# Patient Record
Sex: Female | Born: 1975 | Race: White | Hispanic: No | Smoking: Never smoker
Health system: Southern US, Community
[De-identification: ages and names within clinical notes are randomized; demographics above are authoritative.]

## PROBLEM LIST (undated history)

## (undated) DIAGNOSIS — Z9889 Other specified postprocedural states: Secondary | ICD-10-CM

## (undated) DIAGNOSIS — R112 Nausea with vomiting, unspecified: Secondary | ICD-10-CM

## (undated) DIAGNOSIS — E0520 Thyrotoxicosis with toxic multinodular goiter without thyrotoxic crisis or storm: Secondary | ICD-10-CM

## (undated) DIAGNOSIS — I10 Essential (primary) hypertension: Secondary | ICD-10-CM

## (undated) HISTORY — PX: ABDOMINAL HYSTERECTOMY: SHX81

## (undated) HISTORY — DX: Thyrotoxicosis with toxic multinodular goiter without thyrotoxic crisis or storm: E05.20

## (undated) HISTORY — DX: Essential (primary) hypertension: I10

---

## 2015-04-14 ENCOUNTER — Other Ambulatory Visit: Payer: Self-pay | Admitting: "Endocrinology

## 2015-04-14 DIAGNOSIS — E0590 Thyrotoxicosis, unspecified without thyrotoxic crisis or storm: Secondary | ICD-10-CM

## 2015-04-25 ENCOUNTER — Ambulatory Visit (HOSPITAL_COMMUNITY)
Admission: RE | Admit: 2015-04-25 | Discharge: 2015-04-25 | Disposition: A | Discharge status: Home / Self Care | Payer: Self-pay | Source: Ambulatory Visit | Attending: "Endocrinology | Admitting: "Endocrinology

## 2015-04-25 DIAGNOSIS — E042 Nontoxic multinodular goiter: Secondary | ICD-10-CM | POA: Insufficient documentation

## 2015-04-25 DIAGNOSIS — E0590 Thyrotoxicosis, unspecified without thyrotoxic crisis or storm: Secondary | ICD-10-CM | POA: Insufficient documentation

## 2015-05-21 ENCOUNTER — Ambulatory Visit: Payer: Self-pay | Admitting: "Endocrinology

## 2015-05-23 ENCOUNTER — Other Ambulatory Visit (INDEPENDENT_AMBULATORY_CARE_PROVIDER_SITE_OTHER): Payer: Self-pay | Admitting: Otolaryngology

## 2015-05-23 ENCOUNTER — Other Ambulatory Visit: Payer: Self-pay | Admitting: "Endocrinology

## 2015-05-23 ENCOUNTER — Telehealth: Payer: Self-pay | Admitting: "Endocrinology

## 2015-05-23 DIAGNOSIS — E042 Nontoxic multinodular goiter: Secondary | ICD-10-CM

## 2015-05-23 DIAGNOSIS — R221 Localized swelling, mass and lump, neck: Secondary | ICD-10-CM

## 2015-05-23 NOTE — Telephone Encounter (Signed)
Pt missed apt but confused about who was supposed to schedule her biopsy

## 2015-05-29 NOTE — Telephone Encounter (Signed)
Appt scheduled for 05-06-15. Attempted to call pt several times concerning insurance. No response. Letter sent to pt with appt date and time.

## 2015-06-05 ENCOUNTER — Ambulatory Visit (HOSPITAL_COMMUNITY): Admission: RE | Admit: 2015-06-05 | Payer: Self-pay | Source: Ambulatory Visit

## 2015-06-18 ENCOUNTER — Other Ambulatory Visit (HOSPITAL_COMMUNITY): Payer: Self-pay

## 2015-06-25 ENCOUNTER — Other Ambulatory Visit: Payer: Self-pay | Admitting: "Endocrinology

## 2015-06-25 ENCOUNTER — Encounter (HOSPITAL_COMMUNITY): Payer: Self-pay

## 2015-06-25 ENCOUNTER — Ambulatory Visit (HOSPITAL_COMMUNITY)
Admission: RE | Admit: 2015-06-25 | Discharge: 2015-06-25 | Disposition: A | Discharge status: Home / Self Care | Payer: Self-pay | Source: Ambulatory Visit | Attending: "Endocrinology | Admitting: "Endocrinology

## 2015-06-25 DIAGNOSIS — E042 Nontoxic multinodular goiter: Secondary | ICD-10-CM | POA: Insufficient documentation

## 2015-06-25 MED ORDER — LIDOCAINE HCL (PF) 2 % IJ SOLN
INTRAMUSCULAR | Status: AC
  Filled 2015-06-25: qty 10

## 2015-06-25 NOTE — Procedures (Signed)
PreOperative Dx: BILATERAL thyroid nodules Postoperative Dx: BILATERAL thyroid nodules Procedure:   US guided FNA of BILATERAL thyroid nodule Radiologist:  Tyron RussellBoles Anesthesia:  3 ml of 2% lidocaine Specimen:  FNA x 3 of each nodule, one on LEFT and one on RIGHT  EBL:   None Complications: None

## 2015-06-25 NOTE — Discharge Instructions (Signed)

## 2015-07-04 ENCOUNTER — Ambulatory Visit (INDEPENDENT_AMBULATORY_CARE_PROVIDER_SITE_OTHER): Payer: Self-pay | Admitting: "Endocrinology

## 2015-07-04 ENCOUNTER — Encounter: Payer: Self-pay | Admitting: "Endocrinology

## 2015-07-04 VITALS — BP 143/85 | HR 78 | Ht 60.0 in | Wt 124.6 lb

## 2015-07-04 DIAGNOSIS — E0520 Thyrotoxicosis with toxic multinodular goiter without thyrotoxic crisis or storm: Secondary | ICD-10-CM

## 2015-07-04 NOTE — Progress Notes (Signed)
Subjective:    Patient ID: Cheryl Mckenzie, female    DOB: February 22, 1976, PCP ADDIS,DANIEL, DO   Past Medical History  Diagnosis Date  . 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 Hypertension    Past Surgical History  Procedure Laterality Date  . Abdominal hysterectomy    . Cesarean section     Social History   Social History  . Marital Status: Unknown    Spouse Name: N/A  . Number of Children: N/A  . Years of Education: N/A   Social History Main Topics  . Smoking status: Never Smoker   . Smokeless tobacco: Never Used  . Alcohol Use: Yes     Comment: occ  . Drug Use: No  . Sexual Activity: Not Asked   Other Topics Concern  . None   Social History Narrative   Outpatient Encounter Prescriptions as of 07/04/2015  Medication Sig  . losartan-hydrochlorothiazide (HYZAAR) 50-12.5 MG tablet Take 1 tablet by mouth daily.  . [DISCONTINUED] methimazole (TAPAZOLE) 10 MG tablet Take 10 mg by mouth 3 (three) times daily.   No facility-administered encounter medications on file as of 07/04/2015.   ALLERGIES: No Known Allergies VACCINATION STATUS:  There is no immunization history on file for this patient.  HPI 39 yr old female with medical hx as above. She is here to f/u with repeat TFTs and fine needle aspiration of bilateral thyroid nodules  Her FNA is benign.  She stayed off of methimazole , but did not do her thyroid function test before this visit. She lost 7 pounds intentionally,  she feels better. she denies cold /heat intolerance. she denies dysphagia.   Review of Systems  Constitutional: no weight gain/loss, no fatigue, no subjective hyperthermia/hypothermia Eyes: no blurry vision, no xerophthalmia ENT: no sore throat, no nodules palpated in throat, no dysphagia/odynophagia, no hoarseness Cardiovascular: no CP/SOB/palpitations/leg swelling Respiratory: no cough/SOB Gastrointestinal: no N/V/D/C Musculoskeletal: no muscle/joint  aches Skin: no rashes Neurological: no tremors/numbness/tingling/dizziness Psychiatric: no depression/anxiety  Objective:    BP 143/85 mmHg  Pulse 78  Ht 5' (1.524 m)  Wt 124 lb 9.6 oz (56.518 kg)  BMI 24.33 kg/m2  SpO2 100%  Wt Readings from Last 3 Encounters:  07/04/15 124 lb 9.6 oz (56.518 kg)    Physical Exam  Constitutional: overweight, in NAD Eyes: PERRLA, EOMI, no exophthalmos ENT: moist mucous membranes, thyroid is palpable nodule theft on the right lobe , no cervical lymphadenopathy Cardiovascular: RRR, No MRG Respiratory: CTA B Gastrointestinal: abdomen soft, NT, ND, BS+ Musculoskeletal: no deformities, strength intact in all 4 Skin: moist, warm, no rashes Neurological: no tremor with outstretched hands, DTR normal in all 4    Assessment & Plan:   1. Toxic multinodul goiter:  Review of her prior studies showed she has had hx of toxic MNG. Her u/s showed that right lobe 5cm with 3.7 cm mixed cystic and solid nodule, left lobe 4cm with MNG largest 2.3 cm solid nodules. These nodules are now both biopsied and and  found to be nonmalignant.    Based on her  TFTs in August, she  was adequately treated for hyperthyroidism. She  stayedoff of Methimazole since then, however, she did not repeat her thyroid function test. -I will send her to lab to get fresh thyroid function test today , if hyperthyroidism  relapses , she will be considered for I131 ablative therapy.  - I advised patient to maintain close follow up with her PCP ADDIS,DANIEL, DO for  primary care needs.  Follow up plan: Return in about 2 weeks (around 07/18/2015) for overactive thyroid, labs today.  Marquis LunchGebre Petra Sargeant, MD Phone: [phone]  Fax: [phone]   07/04/2015, 11:11 AM

## 2015-07-18 ENCOUNTER — Ambulatory Visit: Payer: Self-pay | Admitting: "Endocrinology

## 2015-10-12 ENCOUNTER — Other Ambulatory Visit: Payer: Self-pay | Admitting: "Endocrinology

## 2015-10-13 NOTE — Telephone Encounter (Signed)
Do we provide this medication for Cheryl Mckenzie or does this go to PCP?

## 2015-10-13 NOTE — Telephone Encounter (Signed)
She needs to contact her primary care physician for this.

## 2017-08-25 IMAGING — US US THYROID BIOPSY
1 series · 13 of 16 positions shown · non-contrast
Comparison: Thyroid ultrasound 04/25/2015

CLINICAL DATA: BILATERAL thyroid nodules

EXAM:
ULTRASOUND GUIDED NEEDLE ASPIRATE BIOPSY OF A THYROID NODULE
ULTRASOUND GUIDED NEEDLE ASPIRATE BIOPSY OF AN ADDITIONAL THYROID
NODULE

[Series 1: us thyroid biopsy · 0.06mm/px · 16 acquisitions, 13 frames shown]
[im 1/16]
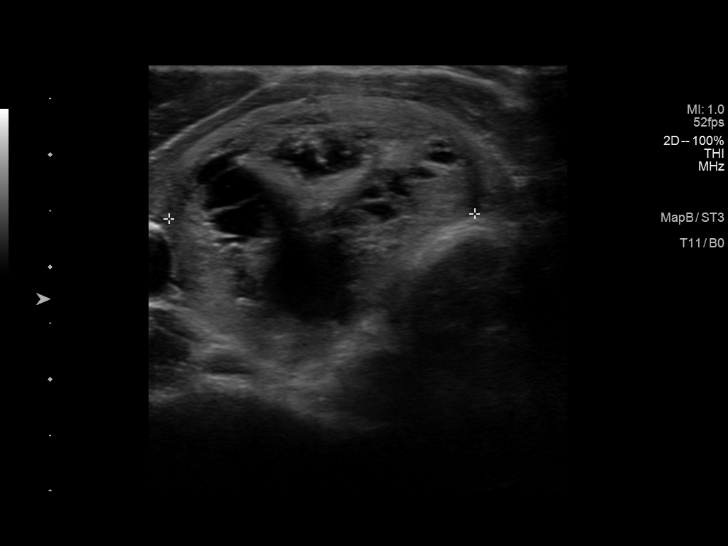
[im 2/16]
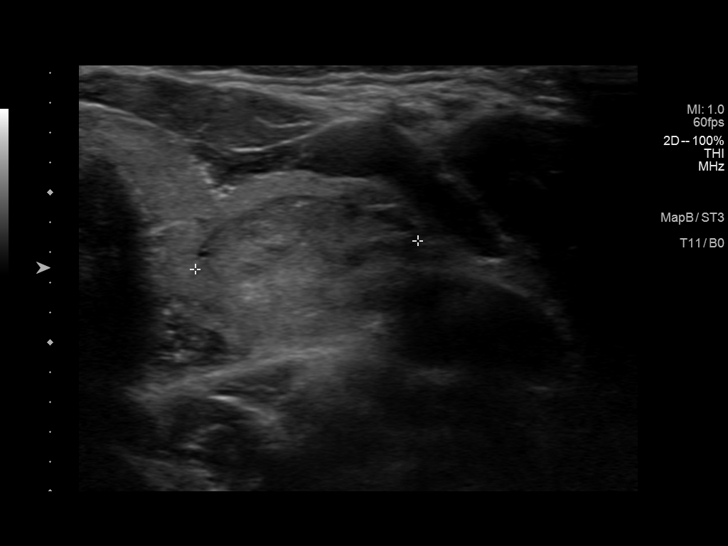
[im 4/16]
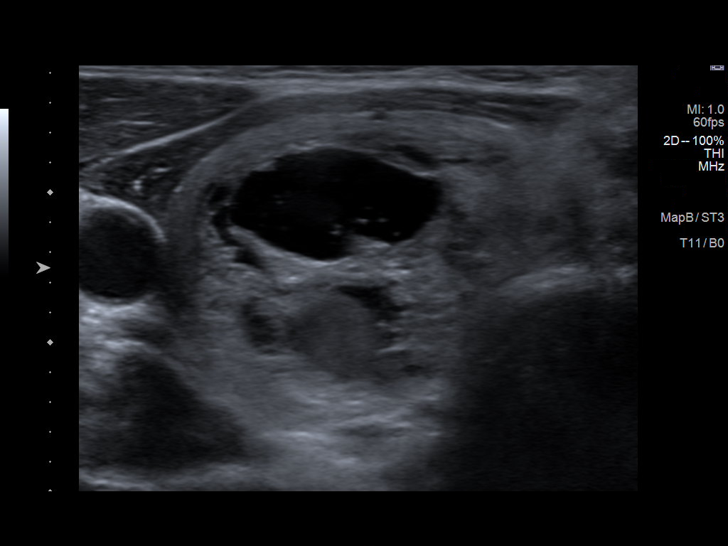
[im 5/16]
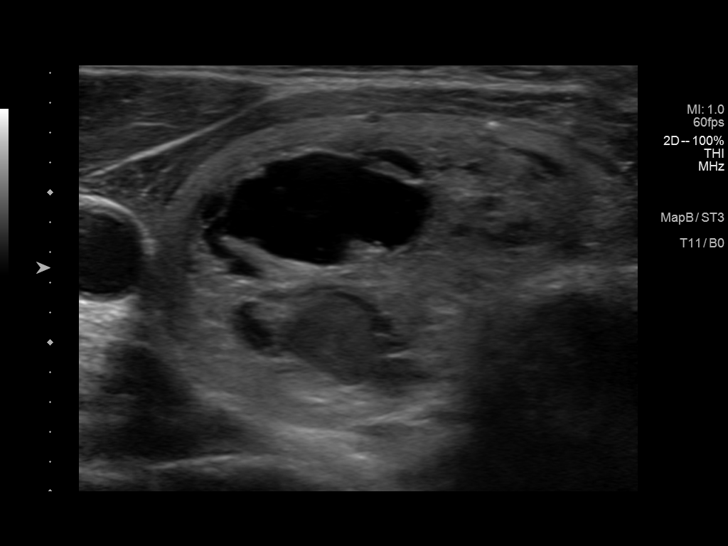
[im 6/16]
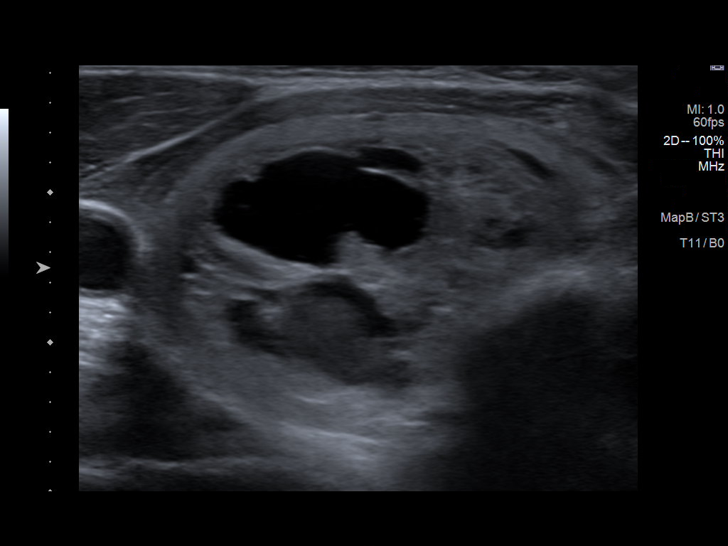
[im 7/16]
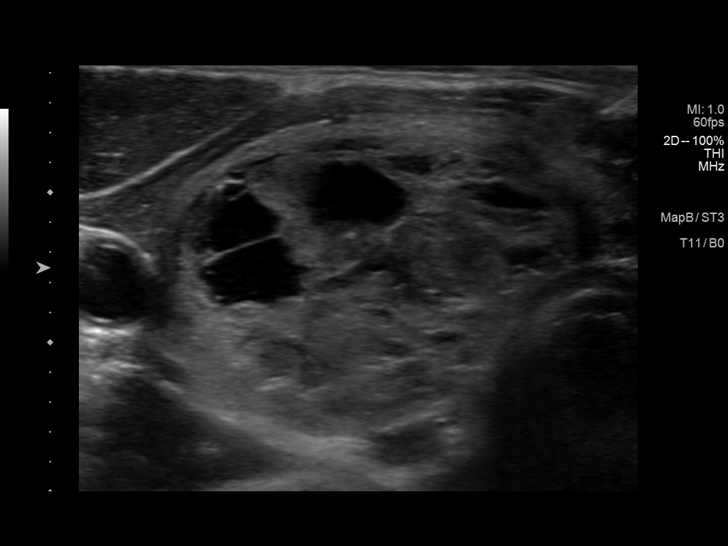
[im 9/16]
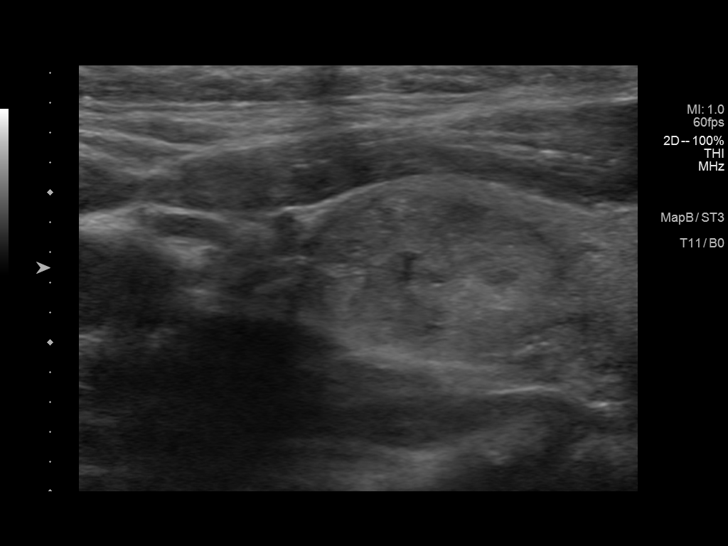
[im 10/16]
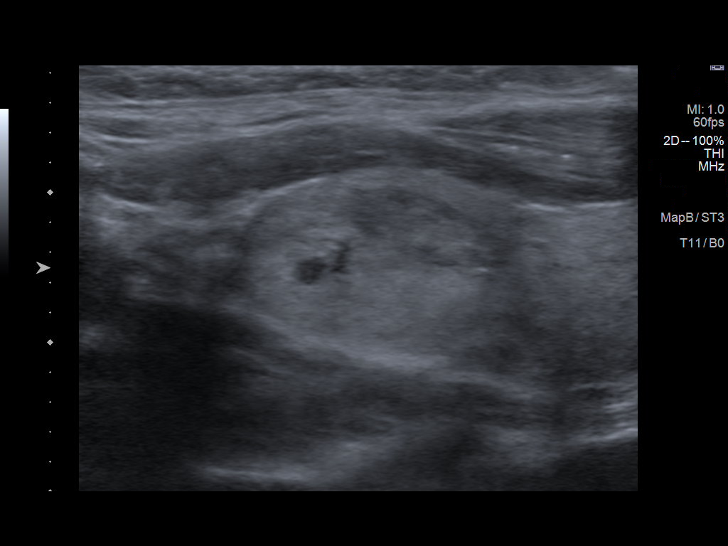
[im 11/16]
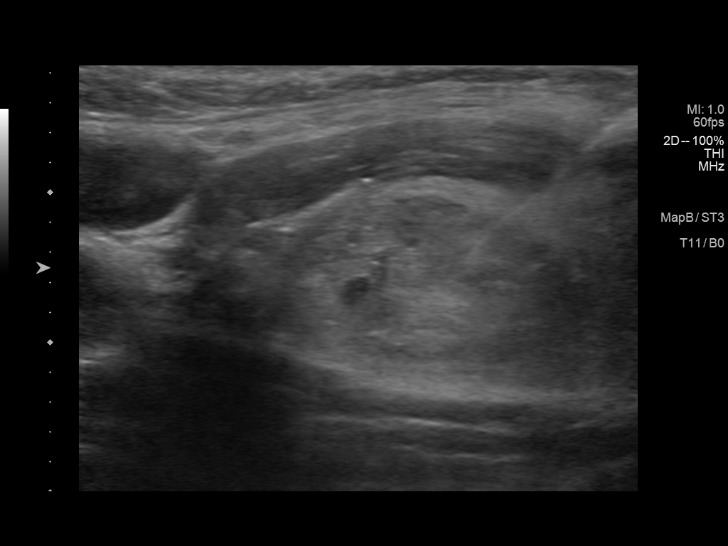
[im 12/16]
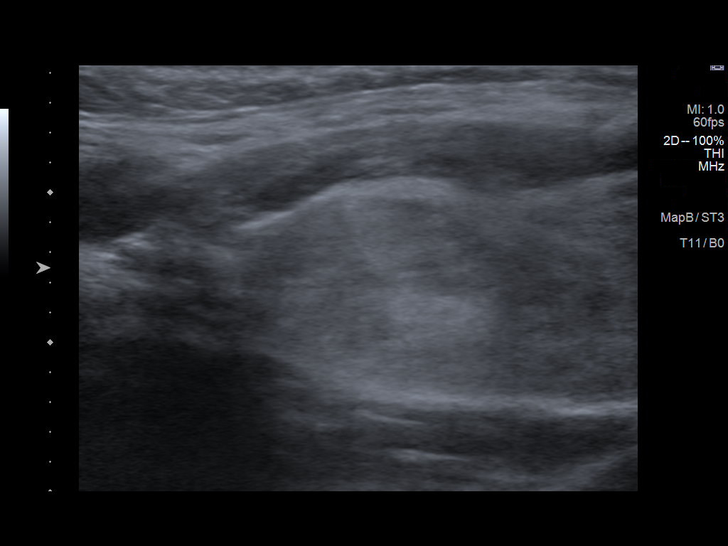
[im 13/16]
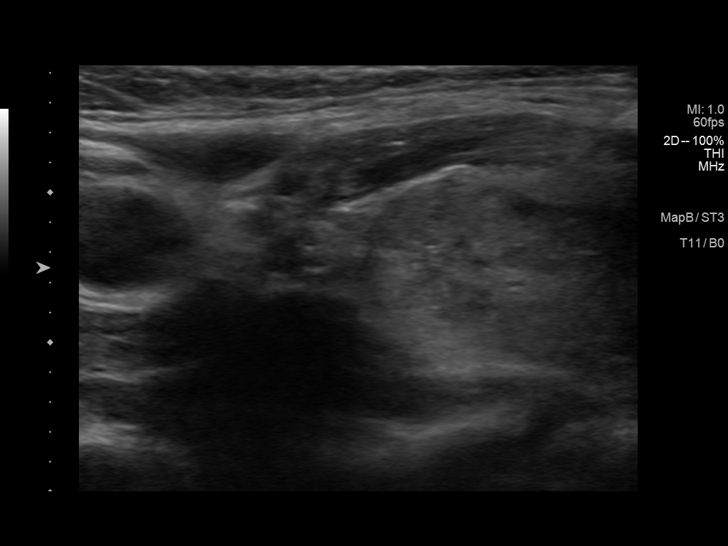
[im 15/16]
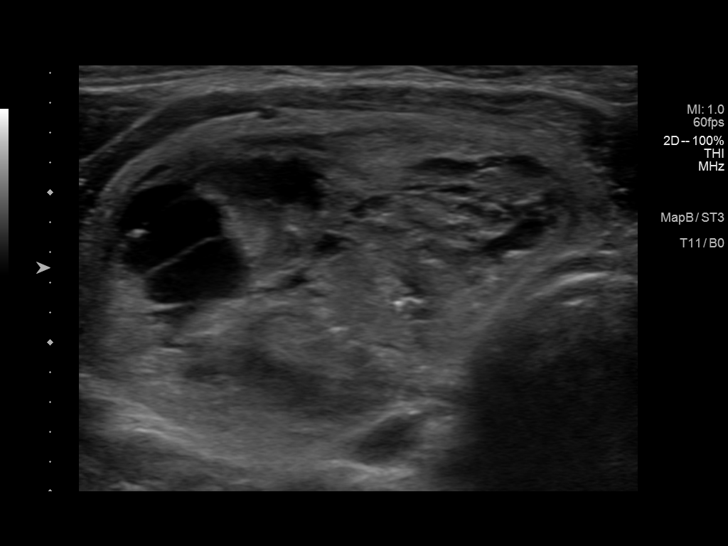
[im 16/16]
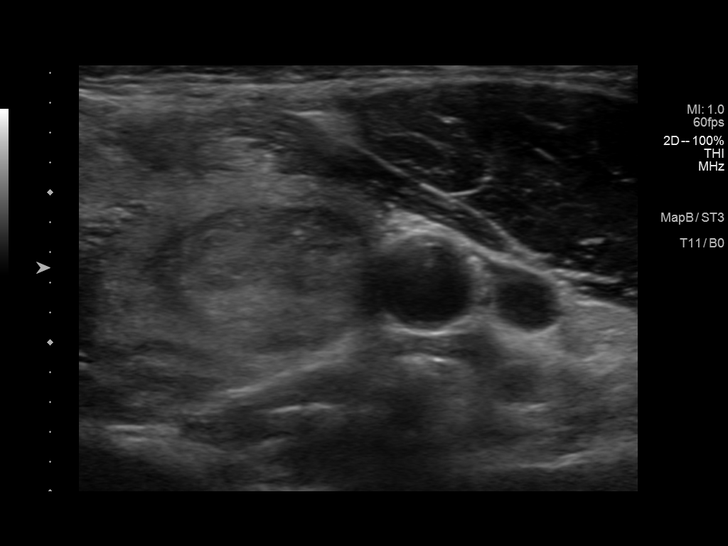

[13 of 16 positions shown; findings below may reference images not displayed]

PROCEDURE:
Procedure, risks, benefits and alternatives discussed with the
patient.

Patient's questions answered.

Written informed consent for thyroid nodule FNA obtained.

Time-out protocol followed.

Dominant nodules in both lobes localized by ultrasound

Dominant RIGHT nodule measures 3.7 x 2.1 x 3.0 cm and is a combined
solid and cystic lesion.

LEFT thyroid nodule is solid in appearance 2.3 x 1.4 x 1.6 cm and
contains tiny microcalcifications.

Skin prepped and draped in usual sterile fashion.

Skin and overlying soft tissues anesthetized with 3 mL of 2%
lidocaine.

Under direct sonographic visualization, 3 fine-needle aspirates of
the RIGHT mass were obtained.

Specimen appeared adequate.

Procedure tolerated well by patient.

Subsequently, under direct sonographic visualization, 3 fine-needle
aspirates of the LEFT thyroid nodule were obtained.

Specimen appeared adequate.

Procedure tolerated well by patient.

No immediate complications.

Specimens sent to laboratory for cytologic assessment.

COMPLICATIONS:
None
FINDINGS: As above
IMPRESSION: Ultrasound guided needle aspirate biopsy performed of BILATERAL
thyroid nodules.

## 2018-06-07 ENCOUNTER — Ambulatory Visit: Payer: Self-pay | Admitting: "Endocrinology

## 2018-07-28 ENCOUNTER — Ambulatory Visit (INDEPENDENT_AMBULATORY_CARE_PROVIDER_SITE_OTHER): Payer: Self-pay | Admitting: "Endocrinology

## 2018-07-28 ENCOUNTER — Encounter: Payer: Self-pay | Admitting: "Endocrinology

## 2018-07-28 ENCOUNTER — Other Ambulatory Visit (HOSPITAL_COMMUNITY)
Admission: RE | Admit: 2018-07-28 | Discharge: 2018-07-28 | Disposition: A | Discharge status: Home / Self Care | Payer: Self-pay | Source: Ambulatory Visit | Attending: "Endocrinology | Admitting: "Endocrinology

## 2018-07-28 VITALS — BP 137/82 | HR 102 | Ht 60.0 in | Wt 169.0 lb

## 2018-07-28 DIAGNOSIS — E0520 Thyrotoxicosis with toxic multinodular goiter without thyrotoxic crisis or storm: Secondary | ICD-10-CM | POA: Insufficient documentation

## 2018-07-28 LAB — T4, FREE: FREE T4: 1.13 ng/dL (ref 0.82–1.77)

## 2018-07-28 LAB — TSH: TSH: 0.008 u[IU]/mL — AB (ref 0.350–4.500)

## 2018-07-28 NOTE — Progress Notes (Signed)
Endocrinology Consult Note                                            07/28/2018, 1:59 PM   Subjective:    Patient ID: Cheryl Mckenzie, female    DOB: 1976-05-14, PCP Renaldo Harrison, DO   Past Medical History:  Diagnosis Date  . Hypertension   . 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 ABDOMINAL HYSTERECTOMY    . CESAREAN SECTION     Social History   Socioeconomic History  . Marital status: Unknown    Spouse name: Not on file  . Number of children: Not on file  . Years of education: Not on file  . Highest education level: Not on file  Occupational History  . Not on file  Social Needs  . Financial resource strain: Not on file  . Food insecurity:    Worry: Not on file    Inability: Not on file  . Transportation needs:    Medical: Not on file    Non-medical: Not on file  Tobacco Use  . Smoking status: Never Smoker  . Smokeless tobacco: Never Used  Substance and Sexual Activity  . Alcohol use: Yes    Comment: occ  . Drug use: No  . Sexual activity: Not on file  Lifestyle  . Physical activity:    Days per week: Not on file    Minutes per session: Not on file  . Stress: Not on file  Relationships  . Social connections:    Talks on phone: Not on file    Gets together: Not on file    Attends religious service: Not on file    Active member of club or organization: Not on file    Attends meetings of clubs or organizations: Not on file    Relationship status: Not on file  Other Topics Concern  . Not on file  Social History Narrative  . Not on file   Outpatient Encounter Medications as of 07/28/2018  Medication Sig  . esomeprazole (NEXIUM) 20 MG capsule Take 20 mg by mouth daily at 12 noon.  Marland Kitchen ESTRIOL-PROGESTERONE MICRO TD Place onto the skin once a week.  . [DISCONTINUED] losartan-hydrochlorothiazide (HYZAAR) 50-12.5 MG tablet Take 1 tablet by mouth daily.   No  facility-administered encounter medications on file as of 07/28/2018.    ALLERGIES: No Known Allergies  VACCINATION STATUS:  There is no immunization history on file for this patient.  HPI Cheryl Mckenzie is 42 y.o. female who presents today with a medical history as above. she is being seen in consultation for toxic multinodular goiter requested by Renaldo Harrison, DO.  Patient was seen prior to November 2016 for same condition.  She underwent fine-needle aspiration biopsy of bilateral thyroid nodules with benign findings.  She has been treated with methimazole for several years.  Her last exposure was 2 months ago.  -She presents with complaints including dysphagia, voice change, as well as positional difficulty breathing. -Her most recent thyroid ultrasound from July 2019 showed increase in the size of right sided thyroid nodule up to 4.3 cm.  She has moderate goiter with right lobe of the thyroid measuring 5.2 cm and left lobe of the thyroid measuring 4.3 cm. -She denies hypertensions, tremors, heat/cold intolerance.  She underwent total hysterectomy in 2015 currently on estrogen supplement. -She denies any family history of thyroid malignancy.  Review of Systems  Constitutional: + Progressive weight gain,  no fatigue, no subjective hyperthermia, no subjective hypothermia Eyes: no blurry vision, no xerophthalmia ENT: no sore throat, no nodules palpated in throat, + dysphagia, +odynophagia, + change in voice  Cardiovascular: no Chest Pain, no Shortness of Breath, no palpitations, no leg swelling Respiratory: no cough, no SOB Gastrointestinal: no Nausea/Vomiting/Diarhhea Musculoskeletal: no muscle/joint aches Skin: no rashes Neurological: no tremors, no numbness, no tingling, no dizziness Psychiatric: no depression, no anxiety  Objective:    BP 137/82   Pulse (!) 102   Ht 5' (1.524 m)   Wt 169 lb (76.7 kg)   BMI 33.01 kg/m   Wt Readings from Last 3 Encounters:  07/28/18 169 lb  (76.7 kg)  07/04/15 124 lb 9.6 oz (56.5 kg)    Physical Exam  Constitutional:  + Obese for height, not in acute distress, normal state of mind Eyes: PERRLA, EOMI, no exophthalmos ENT: moist mucous membranes, + visible, palpable  thyromegaly, no cervical lymphadenopathy, + Pemberton sign Cardiovascular: normal precordial activity, + mild tachycardia, no Murmur/Rubs/Gallops Respiratory:  adequate breathing efforts, no gross chest deformity, Clear to auscultation bilaterally Gastrointestinal: abdomen soft, Non -tender, No distension, Bowel Sounds present Musculoskeletal: no gross deformities, strength intact in all four extremities Skin: moist, warm, no rashes Neurological: no tremor with outstretched hands, Deep tendon reflexes normal in all four extremities.  She does not have recent thyroid function test to review.  Assessment & Plan:   1. Toxic multinodul goiter - Cheryl LamingJean Rankin  is being seen at a kind request of Addis, Reuel BoomDaniel, DO. - I have reviewed her available thyroid records and clinically evaluated the patient. -This patient is known to have longstanding toxic multinodular goiter, status post several years of treatment with methimazole.  2016 bilateral thyroid nodules fine-needle aspiration with benign findings.  Her most recent thyroid ultrasound showed increase in size of right thyroid nodule to 4.3 cm.  Patient presents with compressive symptoms and wishes to have definitive treatment.   -Options were discussed with her including obtaining thyroid uptake and scan and subsequent radioactive iodine thyroid ablation which may not decrease the size of the thyroid significantly for her to get relief from neck compression symptoms.  Her other option is direct  thyroidectomy which will not require thyroid uptake and scan nor fine-needle aspiration.    She agrees with total thyroidectomy, however, she would be sent for a new set of thyroid function tests.  If she is has significant  hyperthyroidism, she will be treated to target before surgery.    -She understands the subsequent need for thyroid hormone replacement after thyroidectomy.  -She will be called if her thyroid function tests are significantly abnormal and requires intervention.  Otherwise she will return to clinic after her surgery with repeat thyroid function tests. -I discussed and prepared her referral to Dr. Franky Mach[place]Mark [place]. - I did not initiate any new prescriptions today. - I advised her  to maintain close follow up with Renaldo HarrisonAddis, Daniel, DO for primary care needs.   - Time spent with the patient: 35 minutes, of which >50% was spent in obtaining information about her symptoms, reviewing her previous labs, evaluations, and treatments, counseling her about her toxic multinodular goiter, and developing a plan to confirm the diagnosis and long term treatment as necessary.  Cheryl LamingJean Vari participated in the discussions, expressed understanding, and voiced  agreement with the above plans.  All questions were answered to her satisfaction. she is encouraged to contact clinic should she have any questions or concerns prior to her return visit.  Follow up plan: Return in about 5 weeks (around 09/01/2018) for Follow up with Pre-visit Labs.   Marquis Lunch, MD [facility] [address] [place], [place] [address] Phone: [phone]  Fax: [phone]     07/28/2018, 1:59 PM  This note was partially dictated with voice recognition software. Similar sounding words can be transcribed inadequately or may not  be corrected upon review.

## 2018-07-29 LAB — THYROGLOBULIN ANTIBODY

## 2018-07-29 LAB — T3, FREE: T3, Free: 5.1 pg/mL — ABNORMAL HIGH (ref 2.0–4.4)

## 2018-07-29 LAB — THYROID PEROXIDASE ANTIBODY: THYROID PEROXIDASE ANTIBODY: 13 [IU]/mL (ref 0–34)

## 2018-08-29 ENCOUNTER — Encounter: Payer: Self-pay | Admitting: General Surgery

## 2018-08-29 ENCOUNTER — Ambulatory Visit: Payer: Self-pay | Admitting: General Surgery

## 2018-08-29 VITALS — BP 178/118 | HR 102 | Temp 98.6°F | Resp 16 | Wt 166.4 lb

## 2018-08-29 DIAGNOSIS — E0520 Thyrotoxicosis with toxic multinodular goiter without thyrotoxic crisis or storm: Secondary | ICD-10-CM

## 2018-08-29 NOTE — Patient Instructions (Signed)
Thyroidectomy A thyroidectomy is a surgery that is done to remove the thyroid gland. The thyroid is a butterfly-shaped gland that is located at the lower front of your neck. It produces thyroid hormone, which is a substance that helps to control certain body processes. You may have a:  Total thyroidectomy. All of your thyroid is removed.  Thyroid lobectomy. Part of your thyroid is removed. The amount of thyroid gland tissue that is removed during your surgery depends on the reason for the procedure. Reasons to have this procedure include treatment for:  Thyroid nodules.  Thyroid cancer.  Benign thyroid tumors.  Goiter.  Overactive thyroid gland (hyperthyroidism). There are two ways to do this procedure. Conventional, or open, thyroidectomy uses one large incision to remove the thyroid gland. This is the most common method. Endoscopic thyroidectomy, a less invasive method, uses a narrow tube with a light and camera (endoscope) to remove the gland. Tell a health care provider about:  Any allergies you have.  All medicines you are taking, including vitamins, herbs, eye drops, creams, and over-the-counter medicines.  Any problems you or family members have had with anesthetic medicines.  Any blood disorders you have.  Any surgeries you have had.  Any medical conditions you have.  Whether you are pregnant or may be pregnant. What are the risks? Generally, this is a safe procedure. However, problems may occur, including:  Damage to the parathyroid glands. These are located behind your thyroid gland. They maintain the calcium levels in the body. Damage may lead to: ? A decrease in parathyroid hormone levels (hypoparathyroidism). ? A decrease in calcium levels. This will make your nerves irritable and may cause muscle spasms.  An increase in thyroid hormone.  Damage to the nerves of your voice box (larynx). This can be temporary or long-term (rare).  Hoarseness. This usually  resolves in 24-48 hours.  Bleeding.  Infection.   Medicines Ask your health care provider about:  Changing or stopping your regular medicines. This is especially important if you are taking diabetes medicines or blood thinners.  Taking medicines such as aspirin and ibuprofen. These medicines can thin your blood. Do not take these medicines unless your health care provider tells you to take them.  Taking over-the-counter medicines, vitamins, herbs, and supplements. General instructions  You may be asked to shower with a germ-killing soap.  Plan to have someone take you home from the hospital or clinic.  Plan to have a responsible adult care for you for at least 24 hours after you leave the hospital or clinic. This is important. What happens during the procedure?  To reduce your risk of infection: ? Your health care team will wash or sanitize their hands. ? Hair may be removed from the surgical area. ? Your skin will be washed with soap.  An IV will be inserted into one of your veins.  You will be given one or more of the following: ? A medicine to help you relax (sedative). ? A medicine to make you fall asleep (general anesthetic).  Your health care provider will perform your surgery using one of two methods: ? For open thyroidectomy, an incision will be made in your lower neck. Muscles in the area will be separated to reveal your thyroid gland. ? For endoscopic thyroidectomy, several small incisions will be made in your neck, chest, or armpit. An endoscopewill be inserted into an incision.  Your health care provider may monitor laryngeal nerve function during the procedure for safety reasons.  Part or all of your thyroid gland will be removed.  A tube (drain) may be placed at the incision site to drain blood and fluids that accumulate under the skin after the procedure. The drain may have to stay in place for a day or two after the procedure.  The incision will be closed  with stitches (sutures).  A dressing will be placed over your incision. The procedure may vary among health care providers and hospitals. What happens after the procedure?  Your blood pressure, heart rate, breathing rate, and blood oxygen level will be monitored often until the medicines you were given have worn off.  You will be given pain medicine as needed.  Your provider will check your ability to talk and swallow after the procedure.  You will gradually start to drink liquids and have soft foods as tolerated.  You may have a blood test to check the level of calcium in your body.  If you had a drain put in during the procedure, it will usually be removed the next day. Summary  A thyroidectomy is a surgery that is done to remove the thyroid gland.  The procedure will be done in one of two ways: conventional, or open, thyroidectomy or endoscopic thyroidectomy.  Serious complications are rare.  Plan to have a responsible adult care for you for at least 24 hours after you leave the hospital or clinic. This is important. This information is not intended to replace advice given to you by your health care provider. Make sure you discuss any questions you have with your health care provider. Document Released: 01/26/2001 Document Revised: 06/07/2017 Document Reviewed: 06/07/2017 Elsevier Interactive Patient Education  2019 [employer].

## 2018-08-29 NOTE — Progress Notes (Signed)
Cheryl Mckenzie; [id number]; 09/21/1975   HPI Patient is a 42-year-old white female who was referred to my care by Drs. 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  She has had the goiter for many years, but seems to have increased in size and occasionally causes compressive symptoms on her throat.  She has not had ongoing dysphasia, but feels that swallowing can be occasionally uncomfortable.  She has been on medication for suppression of her thyroid gland, but given the size of the goiter, she was referred for surgical intervention.  She does not want radioactive iodine therapy.  She has never had an episode of thyroid storm.  There is no family history of thyroid cancer.  She denies any heat intolerance, unexpected weight changes, or heart palpitations.  She currently has 0 out of 10 neck pain.  She denies any voice changes Past Medical History:  Diagnosis Date  . Hypertension   . 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 ABDOMINAL HYSTERECTOMY    . CESAREAN SECTION      Family History  Problem Relation Age of Onset  . Diabetes Mother   . Hypertension Mother   . Stroke Mother     Current Outpatient Medications on File Prior to Visit  Medication Sig Dispense Refill  . esomeprazole (NEXIUM) 20 MG capsule Take 20 mg by mouth daily at 12 noon.    . ESTRIOL-PROGESTERONE MICRO TD Place onto the skin once a week.     No current facility-administered medications on file prior to visit.     No Known Allergies  Social History   Substance and Sexual Activity  Alcohol Use Yes   Comment: occ    Social History   Tobacco Use  Smoking Status Never Smoker  Smokeless Tobacco Never Used    Review of Systems  Constitutional: Positive for malaise/fatigue.  HENT: Positive for sinus pain.   Eyes: Positive for blurred vision and pain.  Respiratory: Negative.   Cardiovascular:  Negative.   Gastrointestinal: Positive for heartburn.  Genitourinary: Negative.   Musculoskeletal: Negative.   Skin: Negative.   Neurological: Negative.   Endo/Heme/Allergies: Negative.   Psychiatric/Behavioral: Negative.     Objective   Vitals:   08/29/18 1440  BP: (!) 178/118  Pulse: (!) 102  Resp: 16  Temp: 98.6 F (37 C)    Physical Exam Vitals signs reviewed.  Constitutional:      Appearance: Normal appearance. She is not ill-appearing.  HENT:     Head: Normocephalic and atraumatic.  Neck:     Musculoskeletal: Normal range of motion and neck supple. No neck rigidity or muscular tenderness.     Comments: Enlarged thyroid gland, right lobe greater than left lobe.  Trachea midline without deviation. Cardiovascular:     Rate and Rhythm: Normal rate and regular rhythm.     Heart sounds: Normal heart sounds. No murmur. No friction rub. No gallop.   Lymphadenopathy:     Cervical: No cervical adenopathy.  Skin:    General: Skin is warm and dry.  Neurological:     Mental Status: She is alert and oriented to person, place, and time.  Psychiatric:        Mood and Affect: Mood normal.        Behavior: Behavior normal.        Thought Content: Thought content normal.    Dr. Nida's notes reviewed Assessment     Toxic multinodular goiter, no history of thyroid storm  Plan   Patient is scheduled for a total thyroidectomy on 09/22/2018.  The risks and benefits of the procedure including bleeding, infection, voice changes, and the possibility of malignancy were fully explained to the patient, who gave informed consent.  She does understand she will be on lifelong thyroid supplementation after the surgery.  

## 2018-08-30 NOTE — H&P (Signed)
Cheryl Mckenzie; [id number]; 07-27-1976   HPI Patient is a 43 year old white female who was referred to my care by Drs. 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  She has had the goiter for many years, but seems to have increased in size and occasionally causes compressive symptoms on her throat.  She has not had ongoing dysphasia, but feels that swallowing can be occasionally uncomfortable.  She has been on medication for suppression of her thyroid gland, but given the size of the goiter, she was referred for surgical intervention.  She does not want radioactive iodine therapy.  She has never had an episode of thyroid storm.  There is no family history of thyroid cancer.  She denies any heat intolerance, unexpected weight changes, or heart palpitations.  She currently has 0 out of 10 neck pain.  She denies any voice changes Past Medical History:  Diagnosis Date  . Hypertension   . 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 ABDOMINAL HYSTERECTOMY    . CESAREAN SECTION      Family History  Problem Relation Age of Onset  . Diabetes Mother   . Hypertension Mother   . Stroke Mother     Current Outpatient Medications on File Prior to Visit  Medication Sig Dispense Refill  . esomeprazole (NEXIUM) 20 MG capsule Take 20 mg by mouth daily at 12 noon.    Marland Kitchen ESTRIOL-PROGESTERONE MICRO TD Place onto the skin once a week.     No current facility-administered medications on file prior to visit.     No Known Allergies  Social History   Substance and Sexual Activity  Alcohol Use Yes   Comment: occ    Social History   Tobacco Use  Smoking Status Never Smoker  Smokeless Tobacco Never Used    Review of Systems  Constitutional: Positive for malaise/fatigue.  HENT: Positive for sinus pain.   Eyes: Positive for blurred vision and pain.  Respiratory: Negative.   Cardiovascular:  Negative.   Gastrointestinal: Positive for heartburn.  Genitourinary: Negative.   Musculoskeletal: Negative.   Skin: Negative.   Neurological: Negative.   Endo/Heme/Allergies: Negative.   Psychiatric/Behavioral: Negative.     Objective   Vitals:   08/29/18 1440  BP: (!) 178/118  Pulse: (!) 102  Resp: 16  Temp: 98.6 F (37 C)    Physical Exam Vitals signs reviewed.  Constitutional:      Appearance: Normal appearance. She is not ill-appearing.  HENT:     Head: Normocephalic and atraumatic.  Neck:     Musculoskeletal: Normal range of motion and neck supple. No neck rigidity or muscular tenderness.     Comments: Enlarged thyroid gland, right lobe greater than left lobe.  Trachea midline without deviation. Cardiovascular:     Rate and Rhythm: Normal rate and regular rhythm.     Heart sounds: Normal heart sounds. No murmur. No friction rub. No gallop.   Lymphadenopathy:     Cervical: No cervical adenopathy.  Skin:    General: Skin is warm and dry.  Neurological:     Mental Status: She is alert and oriented to person, place, and time.  Psychiatric:        Mood and Affect: Mood normal.        Behavior: Behavior normal.        Thought Content: Thought content normal.    Dr. Isidoro Donning notes reviewed Assessment  Toxic multinodular goiter, no history of thyroid storm  Plan   Patient is scheduled for a total thyroidectomy on 09/22/2018.  The risks and benefits of the procedure including bleeding, infection, voice changes, and the possibility of malignancy were fully explained to the patient, who gave informed consent.  She does understand she will be on lifelong thyroid supplementation after the surgery.

## 2018-09-15 NOTE — Patient Instructions (Signed)
Your procedure is scheduled on: 09/22/2018  Report to [facility] at   6:15  AM.  Call this number if you have problems the morning of surgery: [phone]   Remember:   Do not drink or eat food:After Midnight.  :  Take these medicines the morning of surgery with A SIP OF WATER: Omerprazole   Do not wear jewelry, make-up or nail polish.  Do not wear lotions, powders, or perfumes. You may wear deodorant.  Do not shave 48 hours prior to surgery. Men may shave face and neck.  Do not bring valuables to the hospital.  Contacts, dentures or bridgework may not be worn into surgery.  Leave suitcase in the car. After surgery it may be brought to your room.  For patients admitted to the hospital, checkout time is 11:00 AM the day of discharge.   Patients discharged the day of surgery will not be allowed to drive home.    Special Instructions: Shower using CHG night before surgery and shower the day of surgery use CHG.  Use special wash - you have one bottle of CHG for all showers.  You should use approximately 1/2 of the bottle for each shower.  Thyroidectomy A thyroidectomy is a surgery that is done to remove the thyroid gland. The thyroid is a butterfly-shaped gland that is located at the lower front of your neck. It produces thyroid hormone, which is a substance that helps to control certain body processes. You may have a:  Total thyroidectomy. All of your thyroid is removed.  Thyroid lobectomy. Part of your thyroid is removed. The amount of thyroid gland tissue that is removed during your surgery depends on the reason for the procedure. Reasons to have this procedure include treatment for:  Thyroid nodules.  Thyroid cancer.  Benign thyroid tumors.  Goiter.  Overactive thyroid gland (hyperthyroidism). There are two ways to do this procedure. Conventional, or open, thyroidectomy uses one large incision to remove the thyroid gland. This is the most common method. Endoscopic  thyroidectomy, a less invasive method, uses a narrow tube with a light and camera (endoscope) to remove the gland. Tell a health care provider about:  Any allergies you have.  All medicines you are taking, including vitamins, herbs, eye drops, creams, and over-the-counter medicines.  Any problems you or family members have had with anesthetic medicines.  Any blood disorders you have.  Any surgeries you have had.  Any medical conditions you have.  Whether you are pregnant or may be pregnant. What are the risks? Generally, this is a safe procedure. However, problems may occur, including:  Damage to the parathyroid glands. These are located behind your thyroid gland. They maintain the calcium levels in the body. Damage may lead to: ? A decrease in parathyroid hormone levels (hypoparathyroidism). ? A decrease in calcium levels. This will make your nerves irritable and may cause muscle spasms.  An increase in thyroid hormone.  Damage to the nerves of your voice box (larynx). This can be temporary or long-term (rare).  Hoarseness. This usually resolves in 24-48 hours.  Bleeding.  Infection. What happens before the procedure? Staying hydrated Follow instructions from your health care provider about hydration, which may include:  Up to 2 hours before the procedure - you may continue to drink clear liquids, such as water, clear fruit juice, black coffee, and plain tea. Eating and drinking restrictions Follow instructions from your health care provider about eating and drinking, which may include:  8 hours before the  procedure - stop eating heavy meals or foods such as meat, fried foods, or fatty foods.  6 hours before the procedure - stop eating light meals or foods, such as toast or cereal.  6 hours before the procedure - stop drinking milk or drinks that contain milk.  2 hours before the procedure - stop drinking clear liquids. Medicines Ask your health care provider  about:  Changing or stopping your regular medicines. This is especially important if you are taking diabetes medicines or blood thinners.  Taking medicines such as aspirin and ibuprofen. These medicines can thin your blood. Do not take these medicines unless your health care provider tells you to take them.  Taking over-the-counter medicines, vitamins, herbs, and supplements. General instructions  You may be asked to shower with a germ-killing soap.  Plan to have someone take you home from the hospital or clinic.  Plan to have a responsible adult care for you for at least 24 hours after you leave the hospital or clinic. This is important. What happens during the procedure?  To reduce your risk of infection: ? Your health care team will wash or sanitize their hands. ? Hair may be removed from the surgical area. ? Your skin will be washed with soap.  An IV will be inserted into one of your veins.  You will be given one or more of the following: ? A medicine to help you relax (sedative). ? A medicine to make you fall asleep (general anesthetic).  Your health care provider will perform your surgery using one of two methods: ? For open thyroidectomy, an incision will be made in your lower neck. Muscles in the area will be separated to reveal your thyroid gland. ? For endoscopic thyroidectomy, several small incisions will be made in your neck, chest, or armpit. An endoscopewill be inserted into an incision.  Your health care provider may monitor laryngeal nerve function during the procedure for safety reasons.  Part or all of your thyroid gland will be removed.  A tube (drain) may be placed at the incision site to drain blood and fluids that accumulate under the skin after the procedure. The drain may have to stay in place for a day or two after the procedure.  The incision will be closed with stitches (sutures).  A dressing will be placed over your incision. The procedure may vary  among health care providers and hospitals. What happens after the procedure?  Your blood pressure, heart rate, breathing rate, and blood oxygen level will be monitored often until the medicines you were given have worn off.  You will be given pain medicine as needed.  Your provider will check your ability to talk and swallow after the procedure.  You will gradually start to drink liquids and have soft foods as tolerated.  You may have a blood test to check the level of calcium in your body.  If you had a drain put in during the procedure, it will usually be removed the next day. Summary  A thyroidectomy is a surgery that is done to remove the thyroid gland.  The procedure will be done in one of two ways: conventional, or open, thyroidectomy or endoscopic thyroidectomy.  Serious complications are rare.  Plan to have a responsible adult care for you for at least 24 hours after you leave the hospital or clinic. This is important. This information is not intended to replace advice given to you by your health care provider. Make sure you discuss any  questions you have with your health care provider. Document Released: 01/26/2001 Document Revised: 06/07/2017 Document Reviewed: 06/07/2017 Elsevier Interactive Patient Education  2019 [employer]  Thyroidectomy, Care After This sheet gives you information about how to care for yourself after your procedure. Your health care provider may also give you more specific instructions. If you have problems or questions, contact your health care provider. What can I expect after the procedure? After the procedure, it is common to have:  Mild pain in the neck or upper body, especially when swallowing.  A swollen neck.  A sore throat.  A weak or hoarse voice.  Slight tingling or numbness around your mouth, or in your fingers or toes. This may last for a day or two after surgery. This condition is caused by low levels of calcium. You may be  given calcium supplements to treat it. Follow these instructions at home:  Medicines  Take over-the-counter and prescription medicines only as told by your health care provider.  Do not drive or use heavy machinery while taking prescription pain medicine.  Do not take medicines that contain aspirin and ibuprofen until your health care provider says that you can. These medicines can increase your risk of bleeding.  Take a thyroid hormone medicine as recommended by your health care provider. You will have to take this medicine for the rest of your life if your entire thyroid was removed. Eating and drinking  Start slowly with eating. You may need to have only liquids and soft foods for a few days or as directed by your health care provider.  To prevent or treat constipation while you are taking prescription pain medicine, your health care provider may recommend that you: ? Drink enough fluid to keep your urine pale yellow. ? Take over-the-counter or prescription medicines. ? Eat foods that are high in fiber, such as fresh fruits and vegetables, whole grains, and beans. ? Limit foods that are high in fat and processed sugars, such as fried and sweet foods. Incision care  Follow instructions from your health care provider about how to take care of your incision. Make sure you: ? Wash your hands with soap and water before you change your bandage (dressing). If soap and water are not available, use hand sanitizer. ? Change your dressing as told by your health care provider. ? Leave stitches (sutures), skin glue, or adhesive strips in place. These skin closures may need to stay in place for 2 weeks or longer. If adhesive strip edges start to loosen and curl up, you may trim the loose edges. Do not remove adhesive strips completely unless your health care provider tells you to do that.  Check your incision area every day for signs of infection. Check for: ? Redness, swelling, or pain. ? Fluid  or blood. ? Warmth. ? Pus or a bad smell.  Do not take baths, swim, or use a hot tub until your health care provider approves. Activity  For the first 10 days after the procedure or as instructed by your health care provider: ? Do not lift anything that is heavier than 10 lb (4.5 kg). ? Do not jog, swim, or do other strenuous exercises. ? Do not play contact sports.  Avoid sitting for a long time without moving. Get up to take short walks every 1-2 hours. This is needed to improve blood flow and breathing. Ask for help if you feel weak or unsteady.  Return to your normal activities as told by your health care  provider. Ask your health care provider what activities are safe for you. General instructions  Do not use any products that contain nicotine or tobacco, such as cigarettes and e-cigarettes. These can delay healing after surgery. If you need help quitting, ask your health care provider.  Keep all follow-up visits as told by your health care provider. This is important. Your health care provider needs to monitor the calcium level in your blood to make sure that it does not become low. Contact a health care provider if you:  Have a fever.  Have more redness, swelling, or pain around your incision area.  Have fluid or blood coming from your incision area.  Notice that your incision area feels warm to the touch.  Have pus or a bad smell coming from your incision area.  Have trouble talking.  Have nausea or vomiting for more than 2 days. Get help right away if you:  Have trouble breathing.  Have trouble swallowing.  Develop a rash.  Develop a cough that gets worse.  Notice that your speech changes, or you have hoarseness that gets worse.  Develop numbness, tingling, or muscle spasms in the arms, hands, feet, or face. Summary  After the procedure, it is common to feel mild pain in the neck or upper body, especially when swallowing.  Take medicines as told by your  health care provider. These include pain medicines and thyroid hormones, if required.  Follow instructions from your health care provider about how to take care of your incision. Watch for signs of infection.  Keep all follow-up visits as told by your health care provider. This is important. Your health care provider needs to monitor the calcium level in your blood to make sure that it does not become low.  Get help right away if you develop difficulty breathing, or numbness, tingling, or muscle spasms in the arms, hands, feet, or face. This information is not intended to replace advice given to you by your health care provider. Make sure you discuss any questions you have with your health care provider. Document Released: 02/19/2005 Document Revised: 06/07/2017 Document Reviewed: 06/07/2017 Elsevier Interactive Patient Education  2019 [employer]  General Anesthesia, Adult, Care After This sheet gives you information about how to care for yourself after your procedure. Your health care provider may also give you more specific instructions. If you have problems or questions, contact your health care provider. What can I expect after the procedure? After the procedure, the following side effects are common:  Pain or discomfort at the IV site.  Nausea.  Vomiting.  Sore throat.  Trouble concentrating.  Feeling cold or chills.  Weak or tired.  Sleepiness and fatigue.  Soreness and body aches. These side effects can affect parts of the body that were not involved in surgery. Follow these instructions at home:  For at least 24 hours after the procedure:  Have a responsible adult stay with you. It is important to have someone help care for you until you are awake and alert.  Rest as needed.  Do not: ? Participate in activities in which you could fall or become injured. ? Drive. ? Use heavy machinery. ? Drink alcohol. ? Take sleeping pills or medicines that cause  drowsiness. ? Make important decisions or sign legal documents. ? Take care of children on your own. Eating and drinking  Follow any instructions from your health care provider about eating or drinking restrictions.  When you feel hungry, start by eating small amounts  of foods that are soft and easy to digest (bland), such as toast. Gradually return to your regular diet.  Drink enough fluid to keep your urine pale yellow.  If you vomit, rehydrate by drinking water, juice, or clear broth. General instructions  If you have sleep apnea, surgery and certain medicines can increase your risk for breathing problems. Follow instructions from your health care provider about wearing your sleep device: ? Anytime you are sleeping, including during daytime naps. ? While taking prescription pain medicines, sleeping medicines, or medicines that make you drowsy.  Return to your normal activities as told by your health care provider. Ask your health care provider what activities are safe for you.  Take over-the-counter and prescription medicines only as told by your health care provider.  If you smoke, do not smoke without supervision.  Keep all follow-up visits as told by your health care provider. This is important. Contact a health care provider if:  You have nausea or vomiting that does not get better with medicine.  You cannot eat or drink without vomiting.  You have pain that does not get better with medicine.  You are unable to pass urine.  You develop a skin rash.  You have a fever.  You have redness around your IV site that gets worse. Get help right away if:  You have difficulty breathing.  You have chest pain.  You have blood in your urine or stool, or you vomit blood. Summary  After the procedure, it is common to have a sore throat or nausea. It is also common to feel tired.  Have a responsible adult stay with you for the first 24 hours after general anesthesia. It is  important to have someone help care for you until you are awake and alert.  When you feel hungry, start by eating small amounts of foods that are soft and easy to digest (bland), such as toast. Gradually return to your regular diet.  Drink enough fluid to keep your urine pale yellow.  Return to your normal activities as told by your health care provider. Ask your health care provider what activities are safe for you. This information is not intended to replace advice given to you by your health care provider. Make sure you discuss any questions you have with your health care provider. Document Released: 11/08/2000 Document Revised: 03/18/2017 Document Reviewed: 03/18/2017 Elsevier Interactive Patient Education  2019 [employer]

## 2018-09-19 ENCOUNTER — Encounter (HOSPITAL_COMMUNITY): Payer: Self-pay

## 2018-09-20 ENCOUNTER — Ambulatory Visit (HOSPITAL_COMMUNITY)
Admission: RE | Admit: 2018-09-20 | Discharge: 2018-09-20 | Disposition: A | Discharge status: Home / Self Care | Payer: Self-pay | Source: Ambulatory Visit | Attending: General Surgery | Admitting: General Surgery

## 2018-09-20 ENCOUNTER — Other Ambulatory Visit: Payer: Self-pay

## 2018-09-20 ENCOUNTER — Encounter (HOSPITAL_COMMUNITY)
Admission: RE | Admit: 2018-09-20 | Discharge: 2018-09-20 | Disposition: A | Discharge status: Home / Self Care | Payer: Self-pay | Source: Ambulatory Visit | Attending: General Surgery | Admitting: General Surgery

## 2018-09-20 ENCOUNTER — Encounter (HOSPITAL_COMMUNITY): Payer: Self-pay

## 2018-09-20 DIAGNOSIS — E049 Nontoxic goiter, unspecified: Secondary | ICD-10-CM | POA: Insufficient documentation

## 2018-09-20 HISTORY — DX: Nausea with vomiting, unspecified: R11.2

## 2018-09-20 HISTORY — DX: Other specified postprocedural states: Z98.890

## 2018-09-20 LAB — CBC WITH DIFFERENTIAL/PLATELET
Abs Immature Granulocytes: 0.02 10*3/uL (ref 0.00–0.07)
Basophils Absolute: 0.1 10*3/uL (ref 0.0–0.1)
Basophils Relative: 1 %
EOS ABS: 0.1 10*3/uL (ref 0.0–0.5)
Eosinophils Relative: 2 %
HCT: 41.3 % (ref 36.0–46.0)
Hemoglobin: 13.6 g/dL (ref 12.0–15.0)
Immature Granulocytes: 0 %
Lymphocytes Relative: 43 %
Lymphs Abs: 2.4 10*3/uL (ref 0.7–4.0)
MCH: 29.3 pg (ref 26.0–34.0)
MCHC: 32.9 g/dL (ref 30.0–36.0)
MCV: 89 fL (ref 80.0–100.0)
Monocytes Absolute: 0.4 10*3/uL (ref 0.1–1.0)
Monocytes Relative: 7 %
Neutro Abs: 2.6 10*3/uL (ref 1.7–7.7)
Neutrophils Relative %: 47 %
Platelets: 318 10*3/uL (ref 150–400)
RBC: 4.64 MIL/uL (ref 3.87–5.11)
RDW: 12.7 % (ref 11.5–15.5)
WBC: 5.6 10*3/uL (ref 4.0–10.5)
nRBC: 0 % (ref 0.0–0.2)

## 2018-09-20 LAB — COMPREHENSIVE METABOLIC PANEL
ALT: 39 U/L (ref 0–44)
ANION GAP: 8 (ref 5–15)
AST: 28 U/L (ref 15–41)
Albumin: 4 g/dL (ref 3.5–5.0)
Alkaline Phosphatase: 80 U/L (ref 38–126)
BUN: 16 mg/dL (ref 6–20)
CO2: 25 mmol/L (ref 22–32)
Calcium: 9.6 mg/dL (ref 8.9–10.3)
Chloride: 105 mmol/L (ref 98–111)
Creatinine, Ser: 0.76 mg/dL (ref 0.44–1.00)
GFR calc Af Amer: >60 mL/min (ref >60)
GFR calc non Af Amer: >60 mL/min (ref >60)
Glucose, Bld: 100 mg/dL — ABNORMAL HIGH (ref 70–99)
POTASSIUM: 3.8 mmol/L (ref 3.5–5.1)
Sodium: 138 mmol/L (ref 135–145)
Total Bilirubin: 0.4 mg/dL (ref 0.3–1.2)
Total Protein: 7.9 g/dL (ref 6.5–8.1)

## 2018-09-20 LAB — HCG, SERUM, QUALITATIVE: Preg, Serum: NEGATIVE

## 2018-09-20 LAB — TSH: TSH: <0.01 u[IU]/mL — ABNORMAL LOW (ref 0.350–4.500)

## 2018-09-22 ENCOUNTER — Other Ambulatory Visit: Payer: Self-pay

## 2018-09-22 ENCOUNTER — Encounter (HOSPITAL_COMMUNITY)
Admission: RE | Disposition: A | Discharge status: Home / Self Care | Payer: Self-pay | Source: Home / Self Care | Attending: General Surgery

## 2018-09-22 ENCOUNTER — Ambulatory Visit (HOSPITAL_COMMUNITY): Payer: Self-pay | Admitting: Anesthesiology

## 2018-09-22 ENCOUNTER — Observation Stay (HOSPITAL_COMMUNITY)
Admission: RE | Admit: 2018-09-22 | Discharge: 2018-09-23 | Disposition: A | Discharge status: Home / Self Care | Payer: Self-pay | Attending: General Surgery | Admitting: General Surgery

## 2018-09-22 ENCOUNTER — Encounter (HOSPITAL_COMMUNITY): Payer: Self-pay

## 2018-09-22 DIAGNOSIS — Z79890 Hormone replacement therapy (postmenopausal): Secondary | ICD-10-CM | POA: Insufficient documentation

## 2018-09-22 DIAGNOSIS — E0520 Thyrotoxicosis with toxic multinodular goiter without thyrotoxic crisis or storm: Principal | ICD-10-CM | POA: Insufficient documentation

## 2018-09-22 DIAGNOSIS — Z79899 Other long term (current) drug therapy: Secondary | ICD-10-CM | POA: Insufficient documentation

## 2018-09-22 DIAGNOSIS — Z9889 Other specified postprocedural states: Secondary | ICD-10-CM

## 2018-09-22 DIAGNOSIS — I10 Essential (primary) hypertension: Secondary | ICD-10-CM | POA: Insufficient documentation

## 2018-09-22 DIAGNOSIS — E890 Postprocedural hypothyroidism: Secondary | ICD-10-CM

## 2018-09-22 HISTORY — PX: THYROIDECTOMY: SHX17

## 2018-09-22 LAB — COMPREHENSIVE METABOLIC PANEL
ALK PHOS: 77 U/L (ref 38–126)
ALT: 30 U/L (ref 0–44)
AST: 27 U/L (ref 15–41)
Albumin: 3.5 g/dL (ref 3.5–5.0)
Anion gap: 7 (ref 5–15)
BUN: 18 mg/dL (ref 6–20)
CO2: 25 mmol/L (ref 22–32)
Calcium: 8.6 mg/dL — ABNORMAL LOW (ref 8.9–10.3)
Chloride: 104 mmol/L (ref 98–111)
Creatinine, Ser: 0.83 mg/dL (ref 0.44–1.00)
GFR calc Af Amer: >60 mL/min (ref >60)
GFR calc non Af Amer: >60 mL/min (ref >60)
Glucose, Bld: 166 mg/dL — ABNORMAL HIGH (ref 70–99)
Potassium: 4 mmol/L (ref 3.5–5.1)
Sodium: 136 mmol/L (ref 135–145)
Total Bilirubin: 0.4 mg/dL (ref 0.3–1.2)
Total Protein: 6.9 g/dL (ref 6.5–8.1)

## 2018-09-22 SURGERY — THYROIDECTOMY
Anesthesia: General

## 2018-09-22 MED ORDER — HYDROCODONE-ACETAMINOPHEN 5-325 MG PO TABS
1.0000 | ORAL_TABLET | ORAL | Status: DC | PRN
  Administered 2018-09-23: 1 via ORAL
  Filled 2018-09-22: qty 1

## 2018-09-22 MED ORDER — ONDANSETRON HCL 4 MG/2ML IJ SOLN: 4.0000 mg | Freq: Four times a day (QID) | INTRAMUSCULAR | Status: DC | PRN

## 2018-09-22 MED ORDER — ROCURONIUM BROMIDE 100 MG/10ML IV SOLN
INTRAVENOUS | Status: DC | PRN
  Administered 2018-09-22: 40 mg via INTRAVENOUS

## 2018-09-22 MED ORDER — CHLORHEXIDINE GLUCONATE CLOTH 2 % EX PADS: 6.0000 | MEDICATED_PAD | Freq: Once | CUTANEOUS | Status: DC

## 2018-09-22 MED ORDER — HYDROMORPHONE HCL 1 MG/ML IJ SOLN
1.0000 mg | INTRAMUSCULAR | Status: DC | PRN
  Administered 2018-09-22 (×2): 1 mg via INTRAVENOUS
  Filled 2018-09-22 (×2): qty 1

## 2018-09-22 MED ORDER — LACTATED RINGERS IV SOLN
INTRAVENOUS | Status: DC
  Administered 2018-09-22: 07:00:00 via INTRAVENOUS

## 2018-09-22 MED ORDER — PROMETHAZINE HCL 25 MG/ML IJ SOLN
6.2500 mg | INTRAMUSCULAR | Status: DC | PRN
  Administered 2018-09-22: 12.5 mg via INTRAVENOUS
  Filled 2018-09-22: qty 1

## 2018-09-22 MED ORDER — HYDROMORPHONE HCL 1 MG/ML IJ SOLN
0.2500 mg | INTRAMUSCULAR | Status: DC | PRN
  Administered 2018-09-22 (×4): 0.5 mg via INTRAVENOUS
  Filled 2018-09-22 (×4): qty 0.5

## 2018-09-22 MED ORDER — HYDROCHLOROTHIAZIDE 12.5 MG PO CAPS
12.5000 mg | ORAL_CAPSULE | Freq: Every day | ORAL | Status: DC
  Administered 2018-09-22 – 2018-09-23 (×2): 12.5 mg via ORAL
  Filled 2018-09-22 (×4): qty 1

## 2018-09-22 MED ORDER — ACETAMINOPHEN 325 MG PO TABS: 650.0000 mg | ORAL_TABLET | Freq: Four times a day (QID) | ORAL | Status: DC | PRN

## 2018-09-22 MED ORDER — LOSARTAN POTASSIUM 50 MG PO TABS
100.0000 mg | ORAL_TABLET | Freq: Every day | ORAL | Status: DC
  Administered 2018-09-22 – 2018-09-23 (×2): 100 mg via ORAL
  Filled 2018-09-22 (×4): qty 2

## 2018-09-22 MED ORDER — DEXAMETHASONE SODIUM PHOSPHATE 10 MG/ML IJ SOLN
INTRAMUSCULAR | Status: AC
  Filled 2018-09-22: qty 1

## 2018-09-22 MED ORDER — PROPOFOL 10 MG/ML IV BOLUS
INTRAVENOUS | Status: AC
  Filled 2018-09-22: qty 40

## 2018-09-22 MED ORDER — SUCCINYLCHOLINE CHLORIDE 20 MG/ML IJ SOLN
INTRAMUSCULAR | Status: DC | PRN
  Administered 2018-09-22: 120 mg via INTRAVENOUS

## 2018-09-22 MED ORDER — SODIUM CHLORIDE 0.9 % IV SOLN
INTRAVENOUS | Status: DC
  Administered 2018-09-22: 17:00:00 via INTRAVENOUS

## 2018-09-22 MED ORDER — LACTATED RINGERS IV SOLN
INTRAVENOUS | Status: DC
  Administered 2018-09-22: 11:00:00 via INTRAVENOUS

## 2018-09-22 MED ORDER — HYDROCODONE-ACETAMINOPHEN 7.5-325 MG PO TABS: 1.0000 | ORAL_TABLET | Freq: Once | ORAL | Status: DC | PRN

## 2018-09-22 MED ORDER — SODIUM CHLORIDE 0.9 % IR SOLN
Status: DC | PRN
  Administered 2018-09-22: 1000 mL

## 2018-09-22 MED ORDER — DIPHENHYDRAMINE HCL 25 MG PO CAPS: 25.0000 mg | ORAL_CAPSULE | Freq: Four times a day (QID) | ORAL | Status: DC | PRN

## 2018-09-22 MED ORDER — CALCIUM CARBONATE-VITAMIN D 500-200 MG-UNIT PO TABS
2.0000 | ORAL_TABLET | Freq: Two times a day (BID) | ORAL | Status: DC
  Administered 2018-09-22 – 2018-09-23 (×2): 2 via ORAL
  Filled 2018-09-22 (×4): qty 2

## 2018-09-22 MED ORDER — DIPHENHYDRAMINE HCL 50 MG/ML IJ SOLN: 25.0000 mg | Freq: Four times a day (QID) | INTRAMUSCULAR | Status: DC | PRN

## 2018-09-22 MED ORDER — PROPOFOL 10 MG/ML IV BOLUS
INTRAVENOUS | Status: DC | PRN
  Administered 2018-09-22: 180 mg via INTRAVENOUS

## 2018-09-22 MED ORDER — LOSARTAN POTASSIUM-HCTZ 100-12.5 MG PO TABS: 1.0000 | ORAL_TABLET | Freq: Every day | ORAL | Status: DC

## 2018-09-22 MED ORDER — BUPIVACAINE LIPOSOME 1.3 % IJ SUSP
INTRAMUSCULAR | Status: AC
  Filled 2018-09-22: qty 20

## 2018-09-22 MED ORDER — ONDANSETRON HCL 4 MG/2ML IJ SOLN
INTRAMUSCULAR | Status: AC
  Filled 2018-09-22: qty 2

## 2018-09-22 MED ORDER — KETOROLAC TROMETHAMINE 30 MG/ML IJ SOLN: 30.0000 mg | Freq: Four times a day (QID) | INTRAMUSCULAR | Status: DC | PRN

## 2018-09-22 MED ORDER — MIDAZOLAM HCL 2 MG/2ML IJ SOLN
INTRAMUSCULAR | Status: AC
  Filled 2018-09-22: qty 2

## 2018-09-22 MED ORDER — SUGAMMADEX SODIUM 200 MG/2ML IV SOLN
INTRAVENOUS | Status: DC | PRN
  Administered 2018-09-22: 200 mg via INTRAVENOUS

## 2018-09-22 MED ORDER — FENTANYL CITRATE (PF) 100 MCG/2ML IJ SOLN
INTRAMUSCULAR | Status: AC
  Filled 2018-09-22: qty 4

## 2018-09-22 MED ORDER — ONDANSETRON HCL 4 MG/2ML IJ SOLN
INTRAMUSCULAR | Status: DC | PRN
  Administered 2018-09-22: 4 mg via INTRAVENOUS

## 2018-09-22 MED ORDER — ESTRADIOL 0.06 MG/24HR TD PTWK
1.0000 | MEDICATED_PATCH | TRANSDERMAL | Status: DC
  Filled 2018-09-22: qty 1

## 2018-09-22 MED ORDER — MIDAZOLAM HCL 5 MG/5ML IJ SOLN
INTRAMUSCULAR | Status: DC | PRN
  Administered 2018-09-22: 2 mg via INTRAVENOUS

## 2018-09-22 MED ORDER — LIDOCAINE HCL 1 % IJ SOLN
INTRAMUSCULAR | Status: DC | PRN
  Administered 2018-09-22: 50 mg via INTRADERMAL

## 2018-09-22 MED ORDER — HEMOSTATIC AGENTS (NO CHARGE) OPTIME
TOPICAL | Status: DC | PRN
  Administered 2018-09-22 (×2): 1 via TOPICAL

## 2018-09-22 MED ORDER — DEXAMETHASONE SODIUM PHOSPHATE 10 MG/ML IJ SOLN
INTRAMUSCULAR | Status: DC | PRN
  Administered 2018-09-22: 5 mg via INTRAVENOUS

## 2018-09-22 MED ORDER — SUGAMMADEX SODIUM 200 MG/2ML IV SOLN
INTRAVENOUS | Status: AC
  Filled 2018-09-22: qty 2

## 2018-09-22 MED ORDER — MENTHOL 3 MG MT LOZG
1.0000 | LOZENGE | OROMUCOSAL | Status: DC | PRN
  Filled 2018-09-22: qty 9

## 2018-09-22 MED ORDER — BUPIVACAINE LIPOSOME 1.3 % IJ SUSP
INTRAMUSCULAR | Status: DC | PRN
  Administered 2018-09-22: 7 mL

## 2018-09-22 MED ORDER — LEVOTHYROXINE SODIUM 100 MCG PO TABS
100.0000 ug | ORAL_TABLET | Freq: Every day | ORAL | Status: DC
  Administered 2018-09-23: 100 ug via ORAL
  Filled 2018-09-22 (×2): qty 1

## 2018-09-22 MED ORDER — SODIUM CHLORIDE 0.9% FLUSH
INTRAVENOUS | Status: AC
  Filled 2018-09-22: qty 40

## 2018-09-22 MED ORDER — LORAZEPAM 2 MG/ML IJ SOLN: 1.0000 mg | INTRAMUSCULAR | Status: DC | PRN

## 2018-09-22 MED ORDER — KETOROLAC TROMETHAMINE 30 MG/ML IJ SOLN
INTRAMUSCULAR | Status: AC
  Filled 2018-09-22: qty 1

## 2018-09-22 MED ORDER — KETOROLAC TROMETHAMINE 30 MG/ML IJ SOLN
30.0000 mg | Freq: Four times a day (QID) | INTRAMUSCULAR | Status: AC
  Administered 2018-09-22: 30 mg via INTRAVENOUS

## 2018-09-22 MED ORDER — ACETAMINOPHEN 650 MG RE SUPP
650.0000 mg | Freq: Four times a day (QID) | RECTAL | Status: DC | PRN
  Filled 2018-09-22: qty 1

## 2018-09-22 MED ORDER — SUCCINYLCHOLINE CHLORIDE 200 MG/10ML IV SOSY
PREFILLED_SYRINGE | INTRAVENOUS | Status: AC
  Filled 2018-09-22: qty 10

## 2018-09-22 MED ORDER — ONDANSETRON 4 MG PO TBDP: 4.0000 mg | ORAL_TABLET | Freq: Four times a day (QID) | ORAL | Status: DC | PRN

## 2018-09-22 MED ORDER — ROCURONIUM BROMIDE 10 MG/ML (PF) SYRINGE
PREFILLED_SYRINGE | INTRAVENOUS | Status: AC
  Filled 2018-09-22: qty 10

## 2018-09-22 MED ORDER — FENTANYL CITRATE (PF) 100 MCG/2ML IJ SOLN
INTRAMUSCULAR | Status: DC | PRN
  Administered 2018-09-22: 25 ug via INTRAVENOUS
  Administered 2018-09-22: 50 ug via INTRAVENOUS
  Administered 2018-09-22: 25 ug via INTRAVENOUS
  Administered 2018-09-22 (×2): 50 ug via INTRAVENOUS

## 2018-09-22 MED ORDER — MEPERIDINE HCL 50 MG/ML IJ SOLN: 6.2500 mg | INTRAMUSCULAR | Status: DC | PRN

## 2018-09-22 SURGICAL SUPPLY — 56 items
APPLIER CLIP 9.375 SM OPEN (CLIP) ×3
ATTRACTOMAT 16X20 MAGNETIC DRP (DRAPES) ×3 IMPLANT
BLADE SURG 15 STRL LF DISP TIS (BLADE) ×1 IMPLANT
BLADE SURG 15 STRL SS (BLADE) ×2
CHLORAPREP W/TINT 10.5 ML (MISCELLANEOUS) ×3 IMPLANT
CLIP APPLIE 9.375 SM OPEN (CLIP) ×1 IMPLANT
CLOTH BEACON ORANGE TIMEOUT ST (SAFETY) ×3 IMPLANT
COVER LIGHT HANDLE STERIS (MISCELLANEOUS) ×6 IMPLANT
COVER WAND RF STERILE (DRAPES) ×2 IMPLANT
DERMABOND ADVANCED (GAUZE/BANDAGES/DRESSINGS) ×2
DERMABOND ADVANCED .7 DNX12 (GAUZE/BANDAGES/DRESSINGS) ×1 IMPLANT
DRAPE HALF SHEET 40X57 (DRAPES) ×2 IMPLANT
DRAPE LAPAROTOMY 77X122 PED (DRAPES) ×3 IMPLANT
ELECT NDL TIP 2.8 STRL (NEEDLE) ×1 IMPLANT
ELECT NEEDLE TIP 2.8 STRL (NEEDLE) ×3 IMPLANT
ELECT REM PT RETURN 9FT ADLT (ELECTROSURGICAL) ×3
ELECTRODE REM PT RTRN 9FT ADLT (ELECTROSURGICAL) ×1 IMPLANT
GAUZE 4X4 16PLY RFD (DISPOSABLE) ×6 IMPLANT
GLOVE BIOGEL PI IND STRL 6.5 (GLOVE) IMPLANT
GLOVE BIOGEL PI IND STRL 7.0 (GLOVE) ×2 IMPLANT
GLOVE BIOGEL PI INDICATOR 6.5 (GLOVE) ×2
GLOVE BIOGEL PI INDICATOR 7.0 (GLOVE) ×4
GLOVE SURG SS PI 6.5 STRL IVOR (GLOVE) ×2 IMPLANT
GLOVE SURG SS PI 7.5 STRL IVOR (GLOVE) ×3 IMPLANT
GOWN STRL REUS W/ TWL LRG LVL3 (GOWN DISPOSABLE) ×2 IMPLANT
GOWN STRL REUS W/TWL LRG LVL3 (GOWN DISPOSABLE) ×9 IMPLANT
HEMOSTAT ARISTA ABSORB 1G (HEMOSTASIS) ×2 IMPLANT
HEMOSTAT SURGICEL 4X8 (HEMOSTASIS) ×3 IMPLANT
KIT BLADEGUARD II DBL (SET/KITS/TRAYS/PACK) ×3 IMPLANT
KIT TURNOVER KIT A (KITS) ×3 IMPLANT
MANIFOLD NEPTUNE II (INSTRUMENTS) ×3 IMPLANT
MARKER SKIN DUAL TIP RULER LAB (MISCELLANEOUS) ×3 IMPLANT
NDL HYPO 21X1.5 SAFETY (NEEDLE) ×1 IMPLANT
NEEDLE HYPO 21X1.5 SAFETY (NEEDLE) ×3 IMPLANT
NS IRRIG 1000ML POUR BTL (IV SOLUTION) ×3 IMPLANT
PACK BASIC III (CUSTOM PROCEDURE TRAY) ×2
PACK SRG BSC III STRL LF ECLPS (CUSTOM PROCEDURE TRAY) ×1 IMPLANT
PAD ARMBOARD 7.5X6 YLW CONV (MISCELLANEOUS) ×3 IMPLANT
PENCIL HANDSWITCHING (ELECTRODE) ×3 IMPLANT
SET BASIN LINEN APH (SET/KITS/TRAYS/PACK) ×3 IMPLANT
SHEARS HARMONIC 9CM CVD (BLADE) ×3 IMPLANT
SPONGE DRAIN TRACH 4X4 STRL 2S (GAUZE/BANDAGES/DRESSINGS) ×3 IMPLANT
SPONGE INTESTINAL PEANUT (DISPOSABLE) ×17 IMPLANT
STAPLER VISISTAT 35W (STAPLE) ×3 IMPLANT
SUT ETHILON 4 0 PS 2 18 (SUTURE) ×1 IMPLANT
SUT MNCRL AB 4-0 PS2 18 (SUTURE) ×3 IMPLANT
SUT SILK 3 0 (SUTURE)
SUT SILK 3-0 18XBRD TIE 12 (SUTURE) ×1 IMPLANT
SUT VIC AB 2-0 CT2 27 (SUTURE) ×3 IMPLANT
SUT VIC AB 3-0 SH 27 (SUTURE) ×2
SUT VIC AB 3-0 SH 27X BRD (SUTURE) ×1 IMPLANT
SYR 20CC LL (SYRINGE) ×3 IMPLANT
SYR 30ML LL (SYRINGE) ×2 IMPLANT
SYSTEM CHEST DRAIN TLS 7FR (DRAIN) ×1 IMPLANT
TOWEL OR 17X26 4PK STRL BLUE (TOWEL DISPOSABLE) ×3 IMPLANT
YANKAUER SUCT BULB TIP 10FT TU (MISCELLANEOUS) ×3 IMPLANT

## 2018-09-22 NOTE — Anesthesia Procedure Notes (Signed)
Procedure Name: Intubation Date/Time: 09/22/2018 7:35 AM Performed by: Charmaine Downs, CRNA Pre-anesthesia Checklist: Suction available, Emergency Drugs available, Patient identified, Patient being monitored and Timeout performed Patient Re-evaluated:Patient Re-evaluated prior to induction Oxygen Delivery Method: Circle System Utilized Preoxygenation: Pre-oxygenation with 100% oxygen Induction Type: IV induction Ventilation: Mask ventilation without difficulty Laryngoscope Size: Mac and 3 Grade View: Grade II Tube type: Oral Tube size: 7.0 mm Number of attempts: 1 Airway Equipment and Method: stylet Placement Confirmation: ETT inserted through vocal cords under direct vision,  positive ETCO2 and breath sounds checked- equal and bilateral Secured at: 22 cm Tube secured with: Tape Dental Injury: Teeth and Oropharynx as per pre-operative assessment

## 2018-09-22 NOTE — Anesthesia Preprocedure Evaluation (Signed)
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       Plan Discussed with: Anesthesiologist  Anesthesia Plan Comments:         Anesthesia Quick Evaluation

## 2018-09-22 NOTE — Interval H&P Note (Signed)
History and Physical Interval Note:  09/22/2018 7:15 AM  Cheryl Mckenzie  has presented today for surgery, with the diagnosis of multinodular goiter  The various methods of treatment have been discussed with the patient and family. After consideration of risks, benefits and other options for treatment, the patient has consented to  Procedure(s): TOTAL THYROIDECTOMY (N/A) as a surgical intervention .  The patient's history has been reviewed, patient examined, no change in status, stable for surgery.  I have reviewed the patient's chart and labs.  Questions were answered to the patient's satisfaction.     Franky Macho

## 2018-09-22 NOTE — Transfer of Care (Signed)
Immediate Anesthesia Transfer of Care Note  Patient: Shamequa Schillaci  Procedure(s) Performed: TOTAL THYROIDECTOMY (N/A )  Patient Location: PACU  Anesthesia Type:General  Level of Consciousness: drowsy and patient cooperative  Airway & Oxygen Therapy: Patient Spontanous Breathing and Patient connected to face mask oxygen  Post-op Assessment: Report given to RN, Post -op Vital signs reviewed and stable and Patient moving all extremities  Post vital signs: Reviewed and stable  Last Vitals:  Vitals Value Taken Time  BP    Temp    Pulse    Resp    SpO2      Last Pain:  Vitals:   09/22/18 0642  TempSrc: Oral  PainSc: 0-No pain      Patients Stated Pain Goal: 5 (09/22/18 3474)  Complications: No apparent anesthesia complications

## 2018-09-22 NOTE — Op Note (Signed)
Patient:  Cheryl Mckenzie  DOB:  1975-10-02  MRN:  [id number]   Preop Diagnosis: Toxic multinodular goiter  Postop Diagnosis: Same  Procedure: Total thyroidectomy  Surgeon: Franky Macho, MD  Assistant: Larae Grooms, MD  Anes: General endotracheal  Indications: Patient is a 43 year old white female who presents with a symptomatic multinodular goiter.  The risks and benefits of the procedure including bleeding, infection, nerve injury, voice changes, and the possibility of malignancy were fully explained to the patient, who gave informed consent.  Procedure note: The patient was placed in supine position.  After induction of general endotracheal anesthesia, the neck was prepped and draped using usual sterile technique with ChloraPrep.  Surgical site confirmation was performed.  A transverse incision was made in the neck just above the jugular notch.  The platysma was divided using Bovie electrocautery.  The strap muscle was divided longitudinally in order to expose the lobes of the thyroid gland.  On inspection, the patient had a very large lobular right lobe.  The left lobe was smaller.  The right lobe was mobilized.  The inferior thyroidal artery, middle thyroidal artery and vein, and the suspensory ligament of Allyson Sabal were all identified and divided using clips and the harmonic scalpel.  Care was taken to avoid the recurrent laryngeal nerve.  The dissection was taken over to the trachea.  The thyroid gland was removed from the trachea using the harmonic scalpel.  The left lobe of the thyroid gland was then mobilized in the same manner.  A suture was placed in the right lobe for orientation purposes.  The specimen was then sent to pathology for further examination.  No abnormal bleeding was noted at the end of the procedure.  Arista and Surgicel were placed into the thyroid beds.  The the strap muscle was reapproximated using a 2-0 Vicryl running suture.  The platysma was reapproximated using 3-0  Vicryl running suture.  Exparel was instilled into the surrounding wound.  The skin was closed using a 4-0 Monocryl subcuticular suture.  Dermabond was applied.  All tape and needle counts were correct at the end of the procedure.  The patient was extubated in the operating room and transferred to PACU in stable condition.  She was able to phonate the letter E without difficulty.  Complications: None  EBL: 25 cc  Specimen: Thyroid gland

## 2018-09-22 NOTE — Anesthesia Postprocedure Evaluation (Signed)
Anesthesia Post Note  Patient: Cheryl Mckenzie  Procedure(s) Performed: TOTAL THYROIDECTOMY (N/A )  Patient location during evaluation: PACU Anesthesia Type: General Level of consciousness: awake and patient cooperative Pain management: pain level controlled Vital Signs Assessment: post-procedure vital signs reviewed and stable Respiratory status: spontaneous breathing, nonlabored ventilation, respiratory function stable and patient connected to tracheostomy mask oxygen Cardiovascular status: blood pressure returned to baseline Postop Assessment: no apparent nausea or vomiting Anesthetic complications: no     Last Vitals:  Vitals:   09/22/18 0642 09/22/18 0917  BP: (!) 152/94 (!) 141/99  Pulse: 99 (!) 107  Resp: 16 15  Temp: 36.7 C 36.8 C  SpO2: 97% 100%    Last Pain:  Vitals:   09/22/18 0945  TempSrc:   PainSc: 6                  Raiyan Dalesandro J

## 2018-09-23 LAB — CBC
HCT: 36.1 % (ref 36.0–46.0)
HEMOGLOBIN: 11.6 g/dL — AB (ref 12.0–15.0)
MCH: 28.8 pg (ref 26.0–34.0)
MCHC: 32.1 g/dL (ref 30.0–36.0)
MCV: 89.6 fL (ref 80.0–100.0)
Platelets: 291 10*3/uL (ref 150–400)
RBC: 4.03 MIL/uL (ref 3.87–5.11)
RDW: 13.4 % (ref 11.5–15.5)
WBC: 12.9 10*3/uL — ABNORMAL HIGH (ref 4.0–10.5)
nRBC: 0 % (ref 0.0–0.2)

## 2018-09-23 LAB — COMPREHENSIVE METABOLIC PANEL
ALT: 26 U/L (ref 0–44)
AST: 19 U/L (ref 15–41)
Albumin: 3.2 g/dL — ABNORMAL LOW (ref 3.5–5.0)
Alkaline Phosphatase: 67 U/L (ref 38–126)
Anion gap: 9 (ref 5–15)
BUN: 13 mg/dL (ref 6–20)
CO2: 26 mmol/L (ref 22–32)
Calcium: 9 mg/dL (ref 8.9–10.3)
Chloride: 105 mmol/L (ref 98–111)
Creatinine, Ser: 0.74 mg/dL (ref 0.44–1.00)
GFR calc Af Amer: >60 mL/min (ref >60)
GFR calc non Af Amer: >60 mL/min (ref >60)
Glucose, Bld: 98 mg/dL (ref 70–99)
Potassium: 3.8 mmol/L (ref 3.5–5.1)
Sodium: 140 mmol/L (ref 135–145)
Total Bilirubin: 0.2 mg/dL — ABNORMAL LOW (ref 0.3–1.2)
Total Protein: 6.7 g/dL (ref 6.5–8.1)

## 2018-09-23 MED ORDER — LEVOTHYROXINE SODIUM 100 MCG PO TABS: 100.0000 ug | ORAL_TABLET | Freq: Every day | ORAL | 2 refills | Status: DC

## 2018-09-23 MED ORDER — CALCIUM CARBONATE-VITAMIN D 500-200 MG-UNIT PO TABS: 2.0000 | ORAL_TABLET | Freq: Two times a day (BID) | ORAL | 1 refills | Status: DC

## 2018-09-23 NOTE — Discharge Summary (Signed)
Physician Discharge Summary  Patient ID: Cheryl Mckenzie MRN: [id number] DOB/AGE: Dec 24, 1975 43 y.o.  Admit date: 09/22/2018 Discharge date: 09/23/2018  Admission Diagnoses: Multinodular goiter  Discharge Diagnoses: Same Active Problems:   S/P thyroidectomy   S/P total thyroidectomy   Discharged Condition: good  Hospital Course: Patient is a 43 year old white female who presented to [facility] on 09/22/2018 for a total thyroidectomy.  This was for multinodular goiter.  She tolerated the procedure well.  Her postoperative course has been unremarkable.  Her diet was advanced at difficulty.  She was able to phonate the letter E without difficulty.  The patient is being discharged home on 09/23/2018 in good and improving condition.  Treatments: surgery: Total thyroidectomy on 09/22/2018  Discharge Exam: Blood pressure 136/89, pulse 86, temperature 98.5 F (36.9 C), temperature source Oral, resp. rate 19, height 5' (1.524 m), weight 73 kg, SpO2 100 %. General appearance: alert, cooperative and no distress Neck: Neck incision healing well.  No ecchymosis or swelling present. Resp: clear to auscultation bilaterally Cardio: regular rate and rhythm, S1, S2 normal, no murmur, click, rub or gallop  Disposition: Discharge disposition: 01-Home or Self Care       Discharge Instructions    Diet general   Complete by:  As directed    Increase activity slowly   Complete by:  As directed      Allergies as of 09/23/2018      Reactions   Latex Itching, Swelling      Medication List    TAKE these medications   calcium-vitamin D 500-200 MG-UNIT tablet Commonly known as:  OSCAL WITH D Take 2 tablets by mouth 2 (two) times daily with a meal.   estradiol 0.06 MG/24HR Commonly known as:  CLIMARA Place 1 patch onto the skin every Monday.   ibuprofen 200 MG tablet Commonly known as:  ADVIL,MOTRIN Take 400-800 mg by mouth every 8 (eight) hours as needed (for pain).   levothyroxine 100 MCG  tablet Commonly known as:  SYNTHROID, LEVOTHROID Take 1 tablet (100 mcg total) by mouth daily at 6 (six) AM. Start taking on:  September 24, 2018   losartan-hydrochlorothiazide 100-12.5 MG tablet Commonly known as:  HYZAAR Take 1 tablet by mouth daily.   omeprazole 20 MG tablet Commonly known as:  PRILOSEC OTC Take 20 mg by mouth daily before breakfast.      Follow-up Information    Franky Macho, MD. Schedule an appointment as soon as possible for a visit on 10/03/2018.   Specialty:  General Surgery Contact information: [address] [place] [address] [phone]           Signed: Franky Macho 09/23/2018, 8:58 AM

## 2018-09-23 NOTE — Discharge Instructions (Signed)
Thyroidectomy, Care After °This sheet gives you information about how to care for yourself after your procedure. Your health care provider may also give you more specific instructions. If you have problems or questions, contact your health care provider. °What can I expect after the procedure? °After the procedure, it is common to have: °· Mild pain in the neck or upper body, especially when swallowing. °· A swollen neck. °· A sore throat. °· A weak or hoarse voice. °· Slight tingling or numbness around your mouth, or in your fingers or toes. This may last for a day or two after surgery. This condition is caused by low levels of calcium. You may be given calcium supplements to treat it. °Follow these instructions at home: ° °Medicines °· Take over-the-counter and prescription medicines only as told by your health care provider. °· Do not drive or use heavy machinery while taking prescription pain medicine. °· Do not take medicines that contain aspirin and ibuprofen until your health care provider says that you can. These medicines can increase your risk of bleeding. °· Take a thyroid hormone medicine as recommended by your health care provider. You will have to take this medicine for the rest of your life if your entire thyroid was removed. °Eating and drinking °· Start slowly with eating. You may need to have only liquids and soft foods for a few days or as directed by your health care provider. °· To prevent or treat constipation while you are taking prescription pain medicine, your health care provider may recommend that you: °? Drink enough fluid to keep your urine pale yellow. °? Take over-the-counter or prescription medicines. °? Eat foods that are high in fiber, such as fresh fruits and vegetables, whole grains, and beans. °? Limit foods that are high in fat and processed sugars, such as fried and sweet foods. °Incision care °· Follow instructions from your health care provider about how to take care of your  incision. Make sure you: °? Wash your hands with soap and water before you change your bandage (dressing). If soap and water are not available, use hand sanitizer. °? Change your dressing as told by your health care provider. °? Leave stitches (sutures), skin glue, or adhesive strips in place. These skin closures may need to stay in place for 2 weeks or longer. If adhesive strip edges start to loosen and curl up, you may trim the loose edges. Do not remove adhesive strips completely unless your health care provider tells you to do that. °· Check your incision area every day for signs of infection. Check for: °? Redness, swelling, or pain. °? Fluid or blood. °? Warmth. °? Pus or a bad smell. °· Do not take baths, swim, or use a hot tub until your health care provider approves. °Activity °· For the first 10 days after the procedure or as instructed by your health care provider: °? Do not lift anything that is heavier than 10 lb (4.5 kg). °? Do not jog, swim, or do other strenuous exercises. °? Do not play contact sports. °· Avoid sitting for a long time without moving. Get up to take short walks every 1-2 hours. This is needed to improve blood flow and breathing. Ask for help if you feel weak or unsteady. °· Return to your normal activities as told by your health care provider. Ask your health care provider what activities are safe for you. °General instructions °· Do not use any products that contain nicotine or tobacco, such as   cigarettes and e-cigarettes. These can delay healing after surgery. If you need help quitting, ask your health care provider. °· Keep all follow-up visits as told by your health care provider. This is important. Your health care provider needs to monitor the calcium level in your blood to make sure that it does not become low. °Contact a health care provider if you: °· Have a fever. °· Have more redness, swelling, or pain around your incision area. °· Have fluid or blood coming from your  incision area. °· Notice that your incision area feels warm to the touch. °· Have pus or a bad smell coming from your incision area. °· Have trouble talking. °· Have nausea or vomiting for more than 2 days. °Get help right away if you: °· Have trouble breathing. °· Have trouble swallowing. °· Develop a rash. °· Develop a cough that gets worse. °· Notice that your speech changes, or you have hoarseness that gets worse. °· Develop numbness, tingling, or muscle spasms in the arms, hands, feet, or face. °Summary °· After the procedure, it is common to feel mild pain in the neck or upper body, especially when swallowing. °· Take medicines as told by your health care provider. These include pain medicines and thyroid hormones, if required. °· Follow instructions from your health care provider about how to take care of your incision. Watch for signs of infection. °· Keep all follow-up visits as told by your health care provider. This is important. Your health care provider needs to monitor the calcium level in your blood to make sure that it does not become low. °· Get help right away if you develop difficulty breathing, or numbness, tingling, or muscle spasms in the arms, hands, feet, or face. °This information is not intended to replace advice given to you by your health care provider. Make sure you discuss any questions you have with your health care provider. °Document Released: 02/19/2005 Document Revised: 06/07/2017 Document Reviewed: 06/07/2017 °Elsevier Interactive Patient Education © 2019 [employer] ° °

## 2018-09-23 NOTE — Progress Notes (Signed)
Nsg Discharge Note  Admit Date:  09/22/2018 Discharge date: 09/23/2018   Cheryl Mckenzie to be D/C'd Home per MD order.  AVS completed.  Copy for chart, and copy for patient signed, and dated. Patient/caregiver able to verbalize understanding.  Discharge Medication: Allergies as of 09/23/2018      Reactions   Latex Itching, Swelling      Medication List    TAKE these medications   calcium-vitamin D 500-200 MG-UNIT tablet Commonly known as:  OSCAL WITH D Take 2 tablets by mouth 2 (two) times daily with a meal.   estradiol 0.06 MG/24HR Commonly known as:  CLIMARA Place 1 patch onto the skin every Monday.   ibuprofen 200 MG tablet Commonly known as:  ADVIL,MOTRIN Take 400-800 mg by mouth every 8 (eight) hours as needed (for pain).   levothyroxine 100 MCG tablet Commonly known as:  SYNTHROID, LEVOTHROID Take 1 tablet (100 mcg total) by mouth daily at 6 (six) AM. Start taking on:  September 24, 2018   losartan-hydrochlorothiazide 100-12.5 MG tablet Commonly known as:  HYZAAR Take 1 tablet by mouth daily.   omeprazole 20 MG tablet Commonly known as:  PRILOSEC OTC Take 20 mg by mouth daily before breakfast.       Discharge Assessment: Vitals:   09/23/18 0624 09/23/18 0848  BP: 124/86 136/89  Pulse: 80 86  Resp: 18 19  Temp: 97.7 F (36.5 C) 98.5 F (36.9 C)  SpO2: 100% 100%   Skin clean, dry and intact without evidence of skin break down, no evidence of skin tears noted. IV catheter discontinued intact. Site without signs and symptoms of complications - no redness or edema noted at insertion site, patient denies c/o pain - only slight tenderness at site.  Dressing with slight pressure applied.  D/c Instructions-Education: Discharge instructions given to patient/family with verbalized understanding. D/c education completed with patient/family including follow up instructions, medication list, d/c activities limitations if indicated, with other d/c instructions as indicated by  MD - patient able to verbalize understanding, all questions fully answered. Patient instructed to return to ED, call 911, or call MD for any changes in condition.  Patient escorted via WC, and D/C home via private auto.  Adair Laundry, RN 09/23/2018 10:17 AM

## 2018-09-25 ENCOUNTER — Encounter (HOSPITAL_COMMUNITY): Payer: Self-pay | Admitting: General Surgery

## 2018-10-03 ENCOUNTER — Encounter: Payer: Self-pay | Admitting: General Surgery

## 2018-10-03 ENCOUNTER — Ambulatory Visit (INDEPENDENT_AMBULATORY_CARE_PROVIDER_SITE_OTHER): Payer: Self-pay | Admitting: General Surgery

## 2018-10-03 VITALS — BP 142/99 | HR 82 | Temp 97.7°F | Resp 18 | Wt 167.2 lb

## 2018-10-03 DIAGNOSIS — Z09 Encounter for follow-up examination after completed treatment for conditions other than malignant neoplasm: Secondary | ICD-10-CM

## 2018-10-03 NOTE — Progress Notes (Signed)
Subjective:     Cheryl Mckenzie  Here for postoperative visit.  Patient feels much better.  No voice changes have been noted.  She has no perioral tingling or arm twitches.  She is taking her Synthroid and calcium supplementation. Objective:    BP (!) 142/99 (BP Location: Left Arm, Patient Position: Sitting, Cuff Size: Normal)   Pulse 82   Temp 97.7 F (36.5 C) (Temporal)   Resp 18   Wt 167 lb 3.2 oz (75.8 kg)   BMI 32.65 kg/m   General:  alert, cooperative and no distress  Neck incision healing well. Final pathology is negative for malignancy.  1 parathyroid gland was found.  Patient was notified.     Assessment:    Doing well postoperatively.    Plan:   Patient will call Dr. Isidoro Donning office for follow-up.  May return to work without restrictions on 10/08/2018.  Follow-up here as needed.

## 2018-11-09 ENCOUNTER — Ambulatory Visit (INDEPENDENT_AMBULATORY_CARE_PROVIDER_SITE_OTHER): Payer: Self-pay | Admitting: "Endocrinology

## 2018-11-09 ENCOUNTER — Encounter: Payer: Self-pay | Admitting: "Endocrinology

## 2018-11-09 DIAGNOSIS — E890 Postprocedural hypothyroidism: Secondary | ICD-10-CM | POA: Insufficient documentation

## 2018-11-09 LAB — THYROID PEROXIDASE ANTIBODY

## 2018-11-09 LAB — T3, FREE: T3, Free: 2.3 pg/mL (ref 2.3–4.2)

## 2018-11-09 LAB — THYROGLOBULIN ANTIBODY

## 2018-11-09 LAB — TSH: TSH: 4.35 m[IU]/L

## 2018-11-09 LAB — T4, FREE: FREE T4: 1.5 ng/dL (ref 0.8–1.8)

## 2018-11-09 MED ORDER — LEVOTHYROXINE SODIUM 100 MCG PO TABS: 100.0000 ug | ORAL_TABLET | Freq: Every day | ORAL | 1 refills | Status: DC

## 2018-11-09 NOTE — Progress Notes (Signed)
11/09/2018, 12:49 PM                                                    Endocrinology Telephone Visit Follow up Note -During COVID -19 Pandemic   Subjective:    Patient ID: Cheryl Mckenzie, female    DOB: 06-Feb-1976, PCP Wallie RenshawHagan, Elizabeth, FNP   Past Medical History:  Diagnosis Date  . Hypertension   . PONV (postoperative nausea and vomiting)   . 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 ABDOMINAL HYSTERECTOMY    . CESAREAN SECTION     x2  . THYROIDECTOMY N/A 09/22/2018   Procedure: TOTAL THYROIDECTOMY;  Surgeon: Franky MachoJenkins, Mark, MD;  Location: [facility];  Service: General;  Laterality: N/A;   Social History   Socioeconomic History  . Marital status: Divorced    Spouse name: Not on file  . Number of children: Not on file  . Years of education: Not on file  . Highest education level: Not on file  Occupational History  . Not on file  Social Needs  . Financial resource strain: Not on file  . Food insecurity:    Worry: Not on file    Inability: Not on file  . Transportation needs:    Medical: Not on file    Non-medical: Not on file  Tobacco Use  . Smoking status: Never Smoker  . Smokeless tobacco: Never Used  Substance and Sexual Activity  . Alcohol use: Yes    Comment: occ  . Drug use: No  . Sexual activity: Yes    Birth control/protection: Surgical  Lifestyle  . Physical activity:    Days per week: Not on file    Minutes per session: Not on file  . Stress: Not on file  Relationships  . Social connections:    Talks on phone: Not on file    Gets together: Not on file    Attends religious service: Not on file    Active member of club or organization: Not on file    Attends meetings of clubs or organizations: Not on file    Relationship status: Not on file  Other Topics Concern  . Not on file  Social History  Narrative  . Not on file   Outpatient Encounter Medications as of 11/09/2018  Medication Sig  . calcium-vitamin D (OSCAL WITH D) 500-200 MG-UNIT tablet Take 2 tablets by mouth 2 (two) times daily with a meal.  . estradiol (CLIMARA) 0.06 MG/24HR Place 1 patch onto the skin every Monday.  Marland Kitchen. ibuprofen (ADVIL,MOTRIN) 200 MG tablet Take 400-800 mg by mouth every 8 (eight) hours as needed (for pain).  Marland Kitchen. levothyroxine (SYNTHROID, LEVOTHROID) 100 MCG tablet Take 1 tablet (100 mcg total) by mouth daily at 6 (six) AM.  . losartan-hydrochlorothiazide (HYZAAR) 100-12.5 MG tablet Take 1 tablet by mouth daily.  Marland Kitchen. omeprazole (PRILOSEC OTC) 20 MG tablet Take 20 mg by mouth daily before breakfast.  . [DISCONTINUED] levothyroxine (SYNTHROID, LEVOTHROID) 100  MCG tablet Take 1 tablet (100 mcg total) by mouth daily at 6 (six) AM.   No facility-administered encounter medications on file as of 11/09/2018.    ALLERGIES: Allergies  Allergen Reactions  . Latex Itching and Swelling    VACCINATION STATUS:  There is no immunization history on file for this patient.  HPI Cheryl Mckenzie is 43 y.o. female who is engaged for telephone visit for follow-up of postsurgical hypothyroidism.   She underwent parathyroidectomy on September 22, 2022 compressive toxic multinodular goiter.  She is currently on levothyroxine 100 mcg p.o. nightly.  She reports compliance to her medications.  She is still recovering from her surgery.   -She was previously treated with methimazole for toxic multinodular goiter for several years. -For mild hypocalcemia she is also on Os-Cal calcium supplement. -She denies hypertensions, tremors, heat/cold intolerance.  She underwent total hysterectomy in 2015 currently on estrogen supplement. -She denies any family history of thyroid malignancy.    Objective:    There were no vitals taken for this visit.  Wt Readings from Last 3 Encounters:  10/03/18 167 lb 3.2 oz (75.8 kg)  09/22/18 160 lb 15 oz  (73 kg)  09/20/18 160 lb (72.6 kg)    Recent Results (from the past 2160 hour(s))  hCG, serum, qualitative     Status: None   Collection Time: 09/20/18  9:27 AM  Result Value Ref Range   Preg, Serum NEGATIVE NEGATIVE    Comment:        THE SENSITIVITY OF THIS METHODOLOGY IS >10 mIU/mL. Performed at [facility], [address], [place], [place] [address]   CBC WITH DIFFERENTIAL     Status: None   Collection Time: 09/20/18  9:27 AM  Result Value Ref Range   WBC 5.6 4.0 - 10.5 K/uL   RBC 4.64 3.87 - 5.11 MIL/uL   Hemoglobin 13.6 12.0 - 15.0 g/dL   HCT 60.4 54.0 - 98.1 %   MCV 89.0 80.0 - 100.0 fL   MCH 29.3 26.0 - 34.0 pg   MCHC 32.9 30.0 - 36.0 g/dL   RDW 19.1 47.8 - 29.5 %   Platelets 318 150 - 400 K/uL   nRBC 0.0 0.0 - 0.2 %   Neutrophils Relative % 47 %   Neutro Abs 2.6 1.7 - 7.7 K/uL   Lymphocytes Relative 43 %   Lymphs Abs 2.4 0.7 - 4.0 K/uL   Monocytes Relative 7 %   Monocytes Absolute 0.4 0.1 - 1.0 K/uL   Eosinophils Relative 2 %   Eosinophils Absolute 0.1 0.0 - 0.5 K/uL   Basophils Relative 1 %   Basophils Absolute 0.1 0.0 - 0.1 K/uL   Immature Granulocytes 0 %   Abs Immature Granulocytes 0.02 0.00 - 0.07 K/uL    Comment: Performed at [facility], [address] [place], [place] [address]  Comprehensive metabolic panel     Status: Abnormal   Collection Time: 09/20/18  9:27 AM  Result Value Ref Range   Sodium 138 135 - 145 mmol/L   Potassium 3.8 3.5 - 5.1 mmol/L   Chloride 105 98 - 111 mmol/L   CO2 25 22 - 32 mmol/L   Glucose, Bld 100 (H) 70 - 99 mg/dL   BUN 16 6 - 20 mg/dL   Creatinine, Ser 8.65 0.44 - 1.00 mg/dL   Calcium 9.6 8.9 - 78.4 mg/dL   Total Protein 7.9 6.5 - 8.1 g/dL   Albumin 4.0 3.5 - 5.0 g/dL   AST 28 15 -  41 U/L   ALT 39 0 - 44 U/L   Alkaline Phosphatase 80 38 - 126 U/L   Total Bilirubin 0.4 0.3 - 1.2 mg/dL   GFR calc non Af Amer >60 >60 mL/min   GFR calc Af Amer >60 >60 mL/min   Anion gap 8 5 - 15    Comment: Performed at [facility], [address], [place], [place] [address]  TSH     Status: Abnormal   Collection Time: 09/20/18  9:27 AM  Result Value Ref Range   TSH <0.010 (L) 0.350 - 4.500 uIU/mL    Comment: Performed at [facility], [address], [place], [place] [address]  Comprehensive metabolic panel     Status: Abnormal   Collection Time: 09/22/18  3:43 PM  Result Value Ref Range   Sodium 136 135 - 145 mmol/L   Potassium 4.0 3.5 - 5.1 mmol/L   Chloride 104 98 - 111 mmol/L   CO2 25 22 - 32 mmol/L   Glucose, Bld 166 (H) 70 - 99 mg/dL   BUN 18 6 - 20 mg/dL   Creatinine, Ser 9.53 0.44 - 1.00 mg/dL   Calcium 8.6 (L) 8.9 - 10.3 mg/dL   Total Protein 6.9 6.5 - 8.1 g/dL   Albumin 3.5 3.5 - 5.0 g/dL   AST 27 15 - 41 U/L   ALT 30 0 - 44 U/L   Alkaline Phosphatase 77 38 - 126 U/L   Total Bilirubin 0.4 0.3 - 1.2 mg/dL   GFR calc non Af Amer >60 >60 mL/min   GFR calc Af Amer >60 >60 mL/min   Anion gap 7 5 - 15    Comment: Performed at [facility], [address] [place], [place] [address]  Comprehensive metabolic panel     Status: Abnormal   Collection Time: 09/23/18  6:38 AM  Result Value Ref Range   Sodium 140 135 - 145 mmol/L   Potassium 3.8 3.5 - 5.1 mmol/L   Chloride 105 98 - 111 mmol/L   CO2 26 22 - 32 mmol/L   Glucose, Bld 98 70 - 99 mg/dL   BUN 13 6 - 20 mg/dL   Creatinine, Ser 4.35 0.44 - 1.00 mg/dL   Calcium 9.0 8.9 - 68.6 mg/dL   Total Protein 6.7 6.5 - 8.1 g/dL   Albumin 3.2 (L) 3.5 - 5.0 g/dL   AST 19 15 - 41 U/L   ALT 26 0 - 44 U/L   Alkaline Phosphatase 67 38 - 126 U/L   Total Bilirubin 0.2 (L) 0.3 - 1.2 mg/dL   GFR calc non Af Amer >60 >60 mL/min   GFR calc Af Amer >60 >60 mL/min   Anion gap 9 5 - 15    Comment: Performed at [facility], [address], [place], [place] [address]  CBC     Status: Abnormal   Collection Time: 09/23/18  6:38 AM  Result Value Ref Range   WBC 12.9 (H) 4.0 - 10.5 K/uL   RBC 4.03 3.87 - 5.11 MIL/uL   Hemoglobin 11.6 (L) 12.0 - 15.0 g/dL   HCT 29.0  21.1 - 15.5 %   MCV 89.6 80.0 - 100.0 fL   MCH 28.8 26.0 - 34.0 pg   MCHC 32.1 30.0 - 36.0 g/dL   RDW 20.8 02.2 - 33.6 %   Platelets 291 150 - 400 K/uL   nRBC 0.0 0.0 - 0.2 %    Comment: Performed at [facility], [address],  [place], [place] [address]  TSH     Status: None   Collection Time: 11/08/18 11:12 AM  Result Value Ref Range   TSH 4.35 mIU/L    Comment:           Reference Range .           > or = 20 Years  0.40-4.50 .                Pregnancy Ranges           First trimester    0.26-2.66           Second trimester   0.55-2.73           Third trimester    0.43-2.91   T4, free     Status: None   Collection Time: 11/08/18 11:12 AM  Result Value Ref Range   Free T4 1.5 0.8 - 1.8 ng/dL  T3, free     Status: None   Collection Time: 11/08/18 11:12 AM  Result Value Ref Range   T3, Free 2.3 2.3 - 4.2 pg/mL     She does not have recent thyroid function test to review.  Assessment & Plan:   1.  Postsurgical hypothyroidism:  -She underwent total thyroidectomy for toxic multinodular goiter on September 22, 2018.  She is recovering from her surgery.  Her previsit labs are consistent with appropriate replacement with levothyroxine.  She is advised to continue levothyroxine 100 mcg p.o. daily before breakfast.    - We discussed about the correct intake of her thyroid hormone, on empty stomach at fasting, with water, separated by at least 30 minutes from breakfast and other medications,  and separated by more than 4 hours from calcium, iron, multivitamins, acid reflux medications (PPIs). -Patient is made aware of the fact that thyroid hormone replacement is needed for life, dose to be adjusted by periodic monitoring of thyroid function tests.    -Options were discussed with her including obtaining thyroid uptake and scan and subsequent radioactive iodine thyroid ablation which may not decrease the size of the thyroid significantly for her to get relief from neck compression  symptoms.  Her other option is direct  thyroidectomy which will not require thyroid uptake and scan nor fine-needle aspiration.   -She is advised to continue her current supply of Os-Cal until her next visit when she will have a CMP.   - I advised her  to maintain close follow up with Wallie Renshaw, FNP for primary care needs.  Follow up plan: Return in about 3 months (around 02/09/2019) for Follow up with Pre-visit Labs.   Marquis Lunch, MD [facility] [address] [place], [place] [address] Phone: [phone]  Fax: [phone]     11/09/2018, 12:49 PM  This note was partially dictated with voice recognition software. Similar sounding words can be transcribed inadequately or may not  be corrected upon review.

## 2019-02-12 ENCOUNTER — Ambulatory Visit: Payer: Self-pay | Admitting: "Endocrinology

## 2019-02-12 ENCOUNTER — Telehealth: Payer: Self-pay | Admitting: "Endocrinology

## 2019-03-16 ENCOUNTER — Ambulatory Visit: Payer: Self-pay | Admitting: "Endocrinology

## 2019-08-13 ENCOUNTER — Telehealth: Payer: Self-pay | Admitting: "Endocrinology

## 2019-08-13 DIAGNOSIS — E0520 Thyrotoxicosis with toxic multinodular goiter without thyrotoxic crisis or storm: Secondary | ICD-10-CM

## 2019-08-13 NOTE — Telephone Encounter (Signed)
Can you update lab order °

## 2019-08-24 LAB — TSH: TSH: 6.7 mIU/L — ABNORMAL HIGH

## 2019-08-24 LAB — COMPREHENSIVE METABOLIC PANEL
AG Ratio: 1.4 (calc) (ref 1.0–2.5)
ALT: 22 U/L (ref 6–29)
AST: 23 U/L (ref 10–30)
Albumin: 4.6 g/dL (ref 3.6–5.1)
Alkaline phosphatase (APISO): 102 U/L (ref 31–125)
BUN: 11 mg/dL (ref 7–25)
CO2: 30 mmol/L (ref 20–32)
Calcium: 10.1 mg/dL (ref 8.6–10.2)
Chloride: 104 mmol/L (ref 98–110)
Creat: 0.96 mg/dL (ref 0.50–1.10)
Globulin: 3.3 g/dL (calc) (ref 1.9–3.7)
Glucose, Bld: 94 mg/dL (ref 65–139)
Potassium: 5 mmol/L (ref 3.5–5.3)
Sodium: 141 mmol/L (ref 135–146)
Total Bilirubin: 0.3 mg/dL (ref 0.2–1.2)
Total Protein: 7.9 g/dL (ref 6.1–8.1)

## 2019-08-24 LAB — T4, FREE: Free T4: 1.5 ng/dL (ref 0.8–1.8)

## 2019-08-28 ENCOUNTER — Ambulatory Visit (INDEPENDENT_AMBULATORY_CARE_PROVIDER_SITE_OTHER): Payer: Self-pay | Admitting: "Endocrinology

## 2019-08-28 ENCOUNTER — Encounter: Payer: Self-pay | Admitting: "Endocrinology

## 2019-08-28 DIAGNOSIS — E890 Postprocedural hypothyroidism: Secondary | ICD-10-CM

## 2019-08-28 MED ORDER — CALCIUM CARBONATE-VITAMIN D 500-200 MG-UNIT PO TABS: 1.0000 | ORAL_TABLET | Freq: Every day | ORAL | 3 refills | Status: AC

## 2019-08-28 MED ORDER — LEVOTHYROXINE SODIUM 112 MCG PO TABS: 112.0000 ug | ORAL_TABLET | Freq: Every day | ORAL | 1 refills | Status: DC

## 2019-08-28 NOTE — Progress Notes (Signed)
08/28/2019, 2:37 PM                                 Endocrinology Telehealth Visit Follow up Note -During COVID -19 Pandemic  I connected with Cheryl Mckenzie on 08/28/2019   by telephone and verified that I am speaking with the correct person using two identifiers. Fatime Biswell, 12/02/1975. she has verbally consented to this visit. All issues noted in this document were discussed and addressed. The format was not optimal for physical exam.    Subjective:    Patient ID: Cheryl Mckenzie, female    DOB: 04-15-1976, PCP Abigail Miyamoto, FNP   Past Medical History:  Diagnosis Date  . Hypertension   . PONV (postoperative nausea and vomiting)   . 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 ABDOMINAL HYSTERECTOMY    . CESAREAN SECTION     x2  . THYROIDECTOMY N/A 09/22/2018   Procedure: TOTAL THYROIDECTOMY;  Surgeon: Aviva Signs, MD;  Location: [facility];  Service: General;  Laterality: N/A;   Social History   Socioeconomic History  . Marital status: Divorced    Spouse name: Not on file  . Number of children: Not on file  . Years of education: Not on file  . Highest education level: Not on file  Occupational History  . Not on file  Tobacco Use  . Smoking status: Never Smoker  . Smokeless tobacco: Never Used  Substance and Sexual Activity  . Alcohol use: Yes    Comment: occ  . Drug use: No  . Sexual activity: Yes    Birth control/protection: Surgical  Other Topics Concern  . Not on file  Social History Narrative  . Not on file   Social Determinants of Health   Financial Resource Strain:   . Difficulty of Paying Living Expenses: Not on file  Food Insecurity:   . Worried About Charity fundraiser in the Last Year: Not on file  . Ran Out of Food in the Last Year: Not on file  Transportation Needs:   . Lack of Transportation  (Medical): Not on file  . Lack of Transportation (Non-Medical): Not on file  Physical Activity:   . Days of Exercise per Week: Not on file  . Minutes of Exercise per Session: Not on file  Stress:   . Feeling of Stress : Not on file  Social Connections:   . Frequency of Communication with Friends and Family: Not on file  . Frequency of Social Gatherings with Friends and Family: Not on file  . Attends Religious Services: Not on file  . Active Member of Clubs or Organizations: Not on file  . Attends Archivist Meetings: Not on file  . Marital Status: Not on file   Outpatient Encounter Medications as of 08/28/2019  Medication Sig  . calcium-vitamin D (OSCAL WITH D) 500-200 MG-UNIT tablet Take 1 tablet by mouth daily with breakfast.  . estradiol (CLIMARA) 0.06 MG/24HR Place 1 patch onto the skin every Monday.  Marland Kitchen ibuprofen (ADVIL,MOTRIN) 200 MG tablet Take 400-800 mg  by mouth every 8 (eight) hours as needed (for pain).  Marland Kitchen levothyroxine (SYNTHROID) 112 MCG tablet Take 1 tablet (112 mcg total) by mouth daily before breakfast.  . losartan-hydrochlorothiazide (HYZAAR) 100-12.5 MG tablet Take 1 tablet by mouth daily.  Marland Kitchen omeprazole (PRILOSEC OTC) 20 MG tablet Take 20 mg by mouth daily before breakfast.  . [DISCONTINUED] calcium-vitamin D (OSCAL WITH D) 500-200 MG-UNIT tablet Take 2 tablets by mouth 2 (two) times daily with a meal.  . [DISCONTINUED] levothyroxine (SYNTHROID, LEVOTHROID) 100 MCG tablet Take 1 tablet (100 mcg total) by mouth daily at 6 (six) AM.   No facility-administered encounter medications on file as of 08/28/2019.   ALLERGIES: Allergies  Allergen Reactions  . Latex Itching and Swelling    VACCINATION STATUS:  There is no immunization history on file for this patient.  HPI Cheryl Mckenzie is 44 y.o. female who is engaged for telephone visit for follow-up of postsurgical hypothyroidism.   She underwent thyroidectomy on September 22, 2022 compressive toxic multinodular  goiter.  She is currently on levothyroxine 100 mcg p.o. nightly.  She reports compliance to her medications.  -She reports consistency with her medications.  She has no new complaints today.  Her previsit labs are consistent with under replacement.  -For mild hypocalcemia she is also on Os-Cal calcium supplement twice a day with meals. -She denies hypertensions, tremors, heat/cold intolerance.  She underwent total hysterectomy in 2015 currently on estrogen supplement. -She denies any family history of thyroid malignancy.    Objective:    There were no vitals taken for this visit.  Wt Readings from Last 3 Encounters:  10/03/18 167 lb 3.2 oz (75.8 kg)  09/22/18 160 lb 15 oz (73 kg)  09/20/18 160 lb (72.6 kg)    Recent Results (from the past 2160 hour(s))  TSH     Status: Abnormal   Collection Time: 08/23/19  3:30 PM  Result Value Ref Range   TSH 6.70 (H) mIU/L    Comment:           Reference Range .           > or = 20 Years  0.40-4.50 .                Pregnancy Ranges           First trimester    0.26-2.66           Second trimester   0.55-2.73           Third trimester    0.43-2.91   T4, Free     Status: None   Collection Time: 08/23/19  3:30 PM  Result Value Ref Range   Free T4 1.5 0.8 - 1.8 ng/dL  Comprehensive metabolic panel     Status: None   Collection Time: 08/23/19  3:30 PM  Result Value Ref Range   Glucose, Bld 94 65 - 139 mg/dL    Comment: .        Non-fasting reference interval .    BUN 11 7 - 25 mg/dL   Creat 7.32 2.02 - 5.42 mg/dL   BUN/Creatinine Ratio NOT APPLICABLE 6 - 22 (calc)   Sodium 141 135 - 146 mmol/L   Potassium 5.0 3.5 - 5.3 mmol/L   Chloride 104 98 - 110 mmol/L   CO2 30 20 - 32 mmol/L   Calcium 10.1 8.6 - 10.2 mg/dL   Total Protein 7.9 6.1 - 8.1 g/dL   Albumin 4.6 3.6 - 5.1 g/dL  Globulin 3.3 1.9 - 3.7 g/dL (calc)   AG Ratio 1.4 1.0 - 2.5 (calc)   Total Bilirubin 0.3 0.2 - 1.2 mg/dL   Alkaline phosphatase (APISO) 102 31 - 125 U/L    AST 23 10 - 30 U/L   ALT 22 6 - 29 U/L     She does not have recent thyroid function test to review.  Assessment & Plan:   1.  Postsurgical hypothyroidism:  -She underwent total thyroidectomy for toxic multinodular goiter on September 22, 2018.  Her previsit labs are consistent with slight under replacement.  I discussed and increase her levothyroxine to 112 mcg p.o. daily before breakfast.    - We discussed about the correct intake of her thyroid hormone, on empty stomach at fasting, with water, separated by at least 30 minutes from breakfast and other medications,  and separated by more than 4 hours from calcium, iron, multivitamins, acid reflux medications (PPIs). -Patient is made aware of the fact that thyroid hormone replacement is needed for life, dose to be adjusted by periodic monitoring of thyroid function tests.   -She is advised to continue her current supply of Os-Cal once daily with breakfast until her next visit when she will have a CMP.   - I advised her  to maintain close follow up with Wallie Renshaw, FNP for primary care needs.     - Time spent on this patient care encounter:  20 minutes of which 50% was spent in  counseling and the rest reviewing  her current and  previous labs / studies and medications  doses and developing a plan for long term care. Niharika Savino  participated in the discussions, expressed understanding, and voiced agreement with the above plans.  All questions were answered to her satisfaction. she is encouraged to contact clinic should she have any questions or concerns prior to her return visit.  Follow up plan: Return in about 3 months (around 11/26/2019) for Follow up with Pre-visit Labs.   Marquis Lunch, MD [facility] [address] [place], [place] [address] Phone: [phone]  Fax: [phone]     08/28/2019, 2:37 PM  This note was partially dictated with voice recognition software.  Similar sounding words can be transcribed inadequately or may not  be corrected upon review.

## 2019-09-13 ENCOUNTER — Other Ambulatory Visit: Payer: Self-pay

## 2019-09-13 MED ORDER — LEVOTHYROXINE SODIUM 112 MCG PO TABS: 112.0000 ug | ORAL_TABLET | Freq: Every day | ORAL | 0 refills | Status: DC

## 2019-11-28 ENCOUNTER — Ambulatory Visit: Payer: Self-pay | Admitting: "Endocrinology

## 2019-11-29 LAB — COMPLETE METABOLIC PANEL WITH GFR
AG Ratio: 1.2 (calc) (ref 1.0–2.5)
ALT: 16 U/L (ref 6–29)
AST: 17 U/L (ref 10–30)
Albumin: 4.1 g/dL (ref 3.6–5.1)
Alkaline phosphatase (APISO): 105 U/L (ref 31–125)
BUN: 13 mg/dL (ref 7–25)
CO2: 24 mmol/L (ref 20–32)
Calcium: 9.7 mg/dL (ref 8.6–10.2)
Chloride: 103 mmol/L (ref 98–110)
Creat: 0.89 mg/dL (ref 0.50–1.10)
GFR, Est African American: 92 mL/min/{1.73_m2} (ref >=60)
GFR, Est Non African American: 79 mL/min/{1.73_m2} (ref >=60)
Globulin: 3.3 g/dL (calc) (ref 1.9–3.7)
Glucose, Bld: 88 mg/dL (ref 65–139)
Potassium: 4 mmol/L (ref 3.5–5.3)
Sodium: 139 mmol/L (ref 135–146)
Total Bilirubin: 0.3 mg/dL (ref 0.2–1.2)
Total Protein: 7.4 g/dL (ref 6.1–8.1)

## 2019-11-29 LAB — TSH: TSH: 3.2 mIU/L

## 2019-11-29 LAB — T4, FREE: Free T4: 1.4 ng/dL (ref 0.8–1.8)

## 2019-12-05 ENCOUNTER — Ambulatory Visit (INDEPENDENT_AMBULATORY_CARE_PROVIDER_SITE_OTHER): Payer: Self-pay | Admitting: "Endocrinology

## 2019-12-05 ENCOUNTER — Encounter: Payer: Self-pay | Admitting: "Endocrinology

## 2019-12-05 ENCOUNTER — Other Ambulatory Visit: Payer: Self-pay

## 2019-12-05 VITALS — BP 154/87 | HR 84 | Ht 60.0 in | Wt 186.2 lb

## 2019-12-05 DIAGNOSIS — Z6836 Body mass index (BMI) 36.0-36.9, adult: Secondary | ICD-10-CM

## 2019-12-05 DIAGNOSIS — E890 Postprocedural hypothyroidism: Secondary | ICD-10-CM

## 2019-12-05 DIAGNOSIS — E6609 Other obesity due to excess calories: Secondary | ICD-10-CM

## 2019-12-05 MED ORDER — LEVOTHYROXINE SODIUM 112 MCG PO TABS: 112.0000 ug | ORAL_TABLET | Freq: Every day | ORAL | 2 refills | Status: DC

## 2019-12-05 NOTE — Progress Notes (Signed)
12/05/2019, 4:29 PM       Endocrinology follow-up note   Subjective:    Patient ID: Cheryl Mckenzie, female    DOB: [birth year], PCP Abigail Miyamoto, FNP   Past Medical History:  Diagnosis Date  . Hypertension   . PONV (postoperative nausea and vomiting)   . 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 ABDOMINAL HYSTERECTOMY    . CESAREAN SECTION     x2  . THYROIDECTOMY N/A 09/22/2018   Procedure: TOTAL THYROIDECTOMY;  Surgeon: Aviva Signs, MD;  Location: [facility];  Service: General;  Laterality: N/A;   Social History   Socioeconomic History  . Marital status: Divorced    Spouse name: Not on file  . Number of children: Not on file  . Years of education: Not on file  . Highest education level: Not on file  Occupational History  . Not on file  Tobacco Use  . Smoking status: Never Smoker  . Smokeless tobacco: Never Used  Substance and Sexual Activity  . Alcohol use: Yes    Comment: occ  . Drug use: No  . Sexual activity: Yes    Birth control/protection: Surgical  Other Topics Concern  . Not on file  Social History Narrative  . Not on file   Social Determinants of Health   Financial Resource Strain:   . Difficulty of Paying Living Expenses:   Food Insecurity:   . Worried About Charity fundraiser in the Last Year:   . Arboriculturist in the Last Year:   Transportation Needs:   . Film/video editor (Medical):   Marland Kitchen Lack of Transportation (Non-Medical):   Physical Activity:   . Days of Exercise per Week:   . Minutes of Exercise per Session:   Stress:   . Feeling of Stress :   Social Connections:   . Frequency of Communication with Friends and Family:   . Frequency of Social Gatherings with Friends and Family:   . Attends Religious Services:   . Active Member of Clubs or Organizations:   . Attends Theatre manager Meetings:   Marland Kitchen Marital Status:    Outpatient Encounter Medications as of 12/05/2019  Medication Sig  . calcium-vitamin D (OSCAL WITH D) 500-200 MG-UNIT tablet Take 1 tablet by mouth daily with breakfast.  . estradiol (CLIMARA) 0.06 MG/24HR Place 1 patch onto the skin every Monday.  Marland Kitchen ibuprofen (ADVIL,MOTRIN) 200 MG tablet Take 400-800 mg by mouth every 8 (eight) hours as needed (for pain).  Marland Kitchen levothyroxine (SYNTHROID) 112 MCG tablet Take 1 tablet (112 mcg total) by mouth daily before breakfast.  . losartan-hydrochlorothiazide (HYZAAR) 100-12.5 MG tablet Take 1 tablet by mouth daily.  Marland Kitchen omeprazole (PRILOSEC OTC) 20 MG tablet Take 20 mg by mouth daily before breakfast.  . [DISCONTINUED] levothyroxine (SYNTHROID) 112 MCG tablet Take 1 tablet (112 mcg total) by mouth daily before breakfast.   No facility-administered encounter medications on file as of 12/05/2019.   ALLERGIES: Allergies  Allergen Reactions  . Latex Itching and Swelling    VACCINATION STATUS:  There is no immunization history on file for this  patient.  HPI Cheryl Mckenzie is 44 y.o. female who is engaged for telephone visit for follow-up of postsurgical hypothyroidism.   She underwent thyroidectomy on September 22, 2018 for  compressive toxic multinodular goiter.  She is currently on levothyroxine 112 mcg p.o. p.o. daily  before breakfast.  She reports compliance on medication.  -She reports improvement in her previous compressive symptoms. -She expresses concern on her weight gain.  She is not getting back to where to her baseline weight was prior to her surgery.   -For mild hypocalcemia she is also on Os-Cal calcium supplement once a day.  She has not been consistent taking his calcium supplement.    -She denies hypertensions, tremors, heat/cold intolerance.  She underwent total hysterectomy in 2015 currently on estrogen supplement. -She denies any family history of thyroid malignancy.    Objective:    BP  (!) 154/87   Pulse 84   Ht 5' (1.524 m)   Wt 186 lb 3.2 oz (84.5 kg)   BMI 36.36 kg/m   Wt Readings from Last 3 Encounters:  12/05/19 186 lb 3.2 oz (84.5 kg)  10/03/18 167 lb 3.2 oz (75.8 kg)  09/22/18 160 lb 15 oz (73 kg)    Recent Results (from the past 2160 hour(s))  TSH SOLSTAS     Status: None   Collection Time: 11/28/19  3:35 PM  Result Value Ref Range   TSH 3.20 mIU/L    Comment:           Reference Range .           > or = 20 Years  0.40-4.50 .                Pregnancy Ranges           First trimester    0.26-2.66           Second trimester   0.55-2.73           Third trimester    0.43-2.91   T4, free SOLSTAS     Status: None   Collection Time: 11/28/19  3:35 PM  Result Value Ref Range   Free T4 1.4 0.8 - 1.8 ng/dL  COMPLETE METABOLIC PANEL WITH GFR     Status: None   Collection Time: 11/28/19  3:35 PM  Result Value Ref Range   Glucose, Bld 88 65 - 139 mg/dL    Comment: .        Non-fasting reference interval .    BUN 13 7 - 25 mg/dL   Creat 2.87 8.67 - 6.72 mg/dL   GFR, Est Non African American 79 > OR = 60 mL/min/1.72m2   GFR, Est African American 92 > OR = 60 mL/min/1.26m2   BUN/Creatinine Ratio NOT APPLICABLE 6 - 22 (calc)   Sodium 139 135 - 146 mmol/L   Potassium 4.0 3.5 - 5.3 mmol/L   Chloride 103 98 - 110 mmol/L   CO2 24 20 - 32 mmol/L   Calcium 9.7 8.6 - 10.2 mg/dL   Total Protein 7.4 6.1 - 8.1 g/dL   Albumin 4.1 3.6 - 5.1 g/dL   Globulin 3.3 1.9 - 3.7 g/dL (calc)   AG Ratio 1.2 1.0 - 2.5 (calc)   Total Bilirubin 0.3 0.2 - 1.2 mg/dL   Alkaline phosphatase (APISO) 105 31 - 125 U/L   AST 17 10 - 30 U/L   ALT 16 6 - 29 U/L      Assessment & Plan:   1.  Postsurgical hypothyroidism:  -She underwent total thyroidectomy for toxic multinodular goiter on September 22, 2018.  Her previsit thyroid function tests are consistent with appropriate replacement.  She is advised to continue levothyroxine 112 mcg p.o. daily before breakfast.     - We  discussed about the correct intake of her thyroid hormone, on empty stomach at fasting, with water, separated by at least 30 minutes from breakfast and other medications,  and separated by more than 4 hours from calcium, iron, multivitamins, acid reflux medications (PPIs). -Patient is made aware of the fact that thyroid hormone replacement is needed for life, dose to be adjusted by periodic monitoring of thyroid function tests.   -She is advised to continue her current supply of Os-Cal once daily with breakfast until her next visit when she will have a CMP.  2.  Obesity  This is likely secondary to excessive caloric intake. - she  admits there is a room for improvement in her diet and drink choices. -  Suggestion is made for her to avoid simple carbohydrates  from her diet including Cakes, Sweet Desserts / Pastries, Ice Cream, Soda (diet and regular), Sweet Tea, Candies, Chips, Cookies, Sweet Pastries,  Store Bought Juices, Alcohol in Excess of  1-2 drinks a day, Artificial Sweeteners, Coffee Creamer, and "Sugar-free" Products. This will help patient to have stable blood glucose profile and potentially avoid unintended weight gain.  - I advised her  to maintain close follow up with Wallie Renshaw, FNP for primary care needs.     - Time spent on this patient care encounter:  25 minutes of which 50% was spent in  counseling and the rest reviewing  her current and  previous labs / studies and medications  doses and developing a plan for long term care. Cheryl Mckenzie  participated in the discussions, expressed understanding, and voiced agreement with the above plans.  All questions were answered to her satisfaction. she is encouraged to contact clinic should she have any questions or concerns prior to her return visit.   Follow up plan: Return in about 6 months (around 06/05/2020) for Follow up with Pre-visit Labs.   Marquis Lunch, MD [facility] [address] [place], [place] [address] Phone: [phone]  Fax: [phone]     12/05/2019, 4:29 PM  This note was partially dictated with voice recognition software. Similar sounding words can be transcribed inadequately or may not  be corrected upon review.

## 2019-12-05 NOTE — Patient Instructions (Signed)

## 2020-05-23 LAB — LIPID PANEL
Cholesterol: 319 — AB (ref 0–200)
HDL: 52 (ref 35–70)
LDL Cholesterol: 41
Triglycerides: 210 — AB (ref 40–160)

## 2020-05-23 LAB — MICROALBUMIN, URINE: Microalb, Ur: 5.7

## 2020-05-23 LAB — TSH: TSH: 0.91 (ref 0.41–5.90)

## 2020-05-31 LAB — COMPLETE METABOLIC PANEL WITH GFR
AG Ratio: 1.4 (calc) (ref 1.0–2.5)
ALT: 14 U/L (ref 6–29)
AST: 17 U/L (ref 10–30)
Albumin: 4.3 g/dL (ref 3.6–5.1)
Alkaline phosphatase (APISO): 98 U/L (ref 31–125)
BUN: 14 mg/dL (ref 7–25)
CO2: 29 mmol/L (ref 20–32)
Calcium: 9.9 mg/dL (ref 8.6–10.2)
Chloride: 102 mmol/L (ref 98–110)
Creat: 0.96 mg/dL (ref 0.50–1.10)
GFR, Est African American: 83 mL/min/{1.73_m2} (ref >=60)
GFR, Est Non African American: 72 mL/min/{1.73_m2} (ref >=60)
Globulin: 3.1 g/dL (calc) (ref 1.9–3.7)
Glucose, Bld: 103 mg/dL (ref 65–139)
Potassium: 4.1 mmol/L (ref 3.5–5.3)
Sodium: 139 mmol/L (ref 135–146)
Total Bilirubin: 0.4 mg/dL (ref 0.2–1.2)
Total Protein: 7.4 g/dL (ref 6.1–8.1)

## 2020-05-31 LAB — T4, FREE: Free T4: 1.6 ng/dL (ref 0.8–1.8)

## 2020-05-31 LAB — TSH: TSH: 0.61 mIU/L

## 2020-06-06 ENCOUNTER — Other Ambulatory Visit: Payer: Self-pay

## 2020-06-06 ENCOUNTER — Ambulatory Visit (INDEPENDENT_AMBULATORY_CARE_PROVIDER_SITE_OTHER): Payer: Self-pay | Admitting: "Endocrinology

## 2020-06-06 ENCOUNTER — Encounter: Payer: Self-pay | Admitting: "Endocrinology

## 2020-06-06 VITALS — BP 156/110 | HR 72 | Ht 60.0 in | Wt 174.4 lb

## 2020-06-06 DIAGNOSIS — Z6836 Body mass index (BMI) 36.0-36.9, adult: Secondary | ICD-10-CM

## 2020-06-06 DIAGNOSIS — E782 Mixed hyperlipidemia: Secondary | ICD-10-CM

## 2020-06-06 DIAGNOSIS — E890 Postprocedural hypothyroidism: Secondary | ICD-10-CM

## 2020-06-06 DIAGNOSIS — I10 Essential (primary) hypertension: Secondary | ICD-10-CM

## 2020-06-06 DIAGNOSIS — E6609 Other obesity due to excess calories: Secondary | ICD-10-CM | POA: Insufficient documentation

## 2020-06-06 MED ORDER — LISINOPRIL-HYDROCHLOROTHIAZIDE 20-25 MG PO TABS: 1.0000 | ORAL_TABLET | Freq: Every day | ORAL | 1 refills | Status: DC

## 2020-06-06 MED ORDER — SIMVASTATIN 20 MG PO TABS: 20.0000 mg | ORAL_TABLET | Freq: Every day | ORAL | 1 refills | Status: DC

## 2020-06-06 NOTE — Progress Notes (Signed)
06/06/2020, 9:36 AM       Endocrinology follow-up note   Subjective:    Patient ID: Cheryl Mckenzie, female    DOB: 1975/11/29, PCP Wallie Renshaw, FNP   Past Medical History:  Diagnosis Date  . Hypertension   . PONV (postoperative nausea and vomiting)   . 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 ABDOMINAL HYSTERECTOMY    . CESAREAN SECTION     x2  . THYROIDECTOMY N/A 09/22/2018   Procedure: TOTAL THYROIDECTOMY;  Surgeon: Franky Macho, MD;  Location: [facility];  Service: General;  Laterality: N/A;   Social History   Socioeconomic History  . Marital status: Divorced    Spouse name: Not on file  . Number of children: Not on file  . Years of education: Not on file  . Highest education level: Not on file  Occupational History  . Not on file  Tobacco Use  . Smoking status: Never Smoker  . Smokeless tobacco: Never Used  Vaping Use  . Vaping Use: Never used  Substance and Sexual Activity  . Alcohol use: Yes    Comment: occ  . Drug use: No  . Sexual activity: Yes    Birth control/protection: Surgical  Other Topics Concern  . Not on file  Social History Narrative  . Not on file   Social Determinants of Health   Financial Resource Strain:   . Difficulty of Paying Living Expenses: Not on file  Food Insecurity:   . Worried About Programme researcher, broadcasting/film/video in the Last Year: Not on file  . Ran Out of Food in the Last Year: Not on file  Transportation Needs:   . Lack of Transportation (Medical): Not on file  . Lack of Transportation (Non-Medical): Not on file  Physical Activity:   . Days of Exercise per Week: Not on file  . Minutes of Exercise per Session: Not on file  Stress:   . Feeling of Stress : Not on file  Social Connections:   . Frequency of Communication with Friends and Family: Not on file  . Frequency of  Social Gatherings with Friends and Family: Not on file  . Attends Religious Services: Not on file  . Active Member of Clubs or Organizations: Not on file  . Attends Banker Meetings: Not on file  . Marital Status: Not on file   Outpatient Encounter Medications as of 06/06/2020  Medication Sig  . calcium-vitamin D (OSCAL WITH D) 500-200 MG-UNIT tablet Take 1 tablet by mouth daily with breakfast.  . DEXILANT 60 MG capsule Take 1 capsule by mouth daily.  Marland Kitchen estradiol (CLIMARA) 0.06 MG/24HR Place 1 patch onto the skin every Monday.  . levothyroxine (SYNTHROID) 112 MCG tablet Take 1 tablet (112 mcg total) by mouth daily before breakfast.  . lisinopril-hydrochlorothiazide (ZESTORETIC) 20-25 MG tablet Take 1 tablet by mouth daily.  . simvastatin (ZOCOR) 20 MG tablet Take 1 tablet (20 mg total) by mouth at bedtime.  . [DISCONTINUED] ibuprofen (ADVIL,MOTRIN) 200 MG tablet Take 400-800 mg by mouth every 8 (eight) hours as needed (for pain).  . [DISCONTINUED] losartan-hydrochlorothiazide (HYZAAR) 100-12.5 MG  tablet Take 1 tablet by mouth daily.  . [DISCONTINUED] omeprazole (PRILOSEC OTC) 20 MG tablet Take 20 mg by mouth daily before breakfast.   No facility-administered encounter medications on file as of 06/06/2020.   ALLERGIES: Allergies  Allergen Reactions  . Latex Itching and Swelling    VACCINATION STATUS:  There is no immunization history on file for this patient.  HPI Cheryl Mckenzie is 44 y.o. female who is being seen in follow-up for postsurgical hypothyroidism.    She underwent thyroidectomy on September 22, 2018 for  compressive toxic multinodular goiter.  She is currently on levothyroxine 112 mcg p.o. p.o. daily  before breakfast.  She reports compliance to this medication.    -She reports improvement in her previous compressive symptoms. -She has lost 14 pounds since last visit.     -For mild hypocalcemia she is also on Os-Cal calcium supplement once a day.  She has  not been consistent taking her calcium supplement.    -She was found to have uncontrolled hypertension and hyperlipidemia this morning.  He denies hypertensions, tremors, heat/cold intolerance.  She underwent total hysterectomy in 2015 currently on estrogen supplement. -She denies any family history of thyroid malignancy.    Objective:    BP (!) 156/110   Pulse 72   Ht 5' (1.524 m)   Wt 174 lb 6.4 oz (79.1 kg)   BMI 34.06 kg/m   Wt Readings from Last 3 Encounters:  06/06/20 174 lb 6.4 oz (79.1 kg)  12/05/19 186 lb 3.2 oz (84.5 kg)  10/03/18 167 lb 3.2 oz (75.8 kg)    Recent Results (from the past 2160 hour(s))  Microalbumin, urine     Status: None   Collection Time: 05/23/20 12:00 AM  Result Value Ref Range   Microalb, Ur 5.7   Lipid panel     Status: Abnormal   Collection Time: 05/23/20 12:00 AM  Result Value Ref Range   Triglycerides 210 (A) 40 - 160   Cholesterol 319 (A) 0 - 200   HDL 52 35 - 70   LDL Cholesterol 41   TSH     Status: None   Collection Time: 05/23/20 12:00 AM  Result Value Ref Range   TSH 0.91 0.41 - 5.90  TSH     Status: None   Collection Time: 05/30/20  3:40 PM  Result Value Ref Range   TSH 0.61 mIU/L    Comment:           Reference Range .           > or = 20 Years  0.40-4.50 .                Pregnancy Ranges           First trimester    0.26-2.66           Second trimester   0.55-2.73           Third trimester    0.43-2.91   T4, free     Status: None   Collection Time: 05/30/20  3:40 PM  Result Value Ref Range   Free T4 1.6 0.8 - 1.8 ng/dL  COMPLETE METABOLIC PANEL WITH GFR     Status: None   Collection Time: 05/30/20  3:40 PM  Result Value Ref Range   Glucose, Bld 103 65 - 139 mg/dL    Comment: .        Non-fasting reference interval .    BUN 14 7 - 25  mg/dL   Creat 1.610.96 0.960.50 - 0.451.10 mg/dL   GFR, Est Non African American 72 > OR = 60 mL/min/1.3673m2   GFR, Est African American 83 > OR = 60 mL/min/1.3773m2   BUN/Creatinine Ratio NOT  APPLICABLE 6 - 22 (calc)   Sodium 139 135 - 146 mmol/L   Potassium 4.1 3.5 - 5.3 mmol/L   Chloride 102 98 - 110 mmol/L   CO2 29 20 - 32 mmol/L   Calcium 9.9 8.6 - 10.2 mg/dL   Total Protein 7.4 6.1 - 8.1 g/dL   Albumin 4.3 3.6 - 5.1 g/dL   Globulin 3.1 1.9 - 3.7 g/dL (calc)   AG Ratio 1.4 1.0 - 2.5 (calc)   Total Bilirubin 0.4 0.2 - 1.2 mg/dL   Alkaline phosphatase (APISO) 98 31 - 125 U/L   AST 17 10 - 30 U/L   ALT 14 6 - 29 U/L      Assessment & Plan:   1.  Postsurgical hypothyroidism:  -She underwent total thyroidectomy for toxic multinodular goiter on September 22, 2018.  Her previsit thyroid function tests are consistent with appropriate replacement.  She is advised to continue levothyroxine 112 mcg p.o. daily before breakfast.     - We discussed about the correct intake of her thyroid hormone, on empty stomach at fasting, with water, separated by at least 30 minutes from breakfast and other medications,  and separated by more than 4 hours from calcium, iron, multivitamins, acid reflux medications (PPIs). -Patient is made aware of the fact that thyroid hormone replacement is needed for life, dose to be adjusted by periodic monitoring of thyroid function tests.   -She is advised to continue her current supply of Os-Cal once daily with breakfast until her next visit when she will have a CMP.  2.  Obesity  This is likely secondary to excessive caloric intake.  She has lost 14 pounds since last visit. - she  admits there is a room for improvement in her diet and drink choices. -  Suggestion is made for her to avoid simple carbohydrates  from her diet including Cakes, Sweet Desserts / Pastries, Ice Cream, Soda (diet and regular), Sweet Tea, Candies, Chips, Cookies, Sweet Pastries,  Store Bought Juices, Alcohol in Excess of  1-2 drinks a day, Artificial Sweeteners, Coffee Creamer, and "Sugar-free" Products. This will help patient to have stable blood glucose profile and potentially avoid  unintended weight gain.   3.  Hypertension -Reportedly, patient was given prescription for hypertension by her PCP.  She hesitated and did not take it.  I had a long discussion with her about the risks of uncontrolled hypertension and a portion with a prescription for lisinopril/HCTZ 20/25 mg p.o. daily.  She hesitantly accepts.  I advised her to follow-up with her PCP in 3 months.    4.  Severe dyslipidemia -first diagnosed with In light of her LDL above 200, she will benefit from early initiation of statin.  I discussed and initiated simvastatin 20 mg p.o. nightly.  Side effects and precautions discussed with her.  She is hesitant, and more interested to explore natural options.  I shared with her the fact that statins are the best medications for the kind of lipid disorder she has.   Advised to follow-up with her PCP in 3 months.     - I advised her  to maintain close follow up with Wallie RenshawHagan, Elizabeth, FNP for primary care needs.      - Time spent on this patient  care encounter:  30 minutes of which 50% was spent in  counseling and the rest reviewing  her current and  previous labs / studies and medications  doses and developing a plan for long term care. Cheryl Mckenzie  participated in the discussions, expressed understanding, and voiced agreement with the above plans.  All questions were answered to her satisfaction. she is encouraged to contact clinic should she have any questions or concerns prior to her return visit.   Follow up plan: Return in about 6 months (around 12/05/2020) for F/U with Pre-visit Labs.   Marquis Lunch, MD [facility] [address] [place], [place] [address] Phone: [phone]  Fax: [phone]     06/06/2020, 9:36 AM  This note was partially dictated with voice recognition software. Similar sounding words can be transcribed inadequately or may not  be corrected upon review.

## 2020-11-19 ENCOUNTER — Other Ambulatory Visit: Payer: Self-pay | Admitting: "Endocrinology

## 2020-11-20 IMAGING — DX DG CHEST 2V
2 series · 2 of 2 positions shown · non-contrast
Comparison: None.

CLINICAL DATA: Preoperative thyroidectomy.  Hypertension.

EXAM:
CHEST - 2 VIEW

[chest pa]
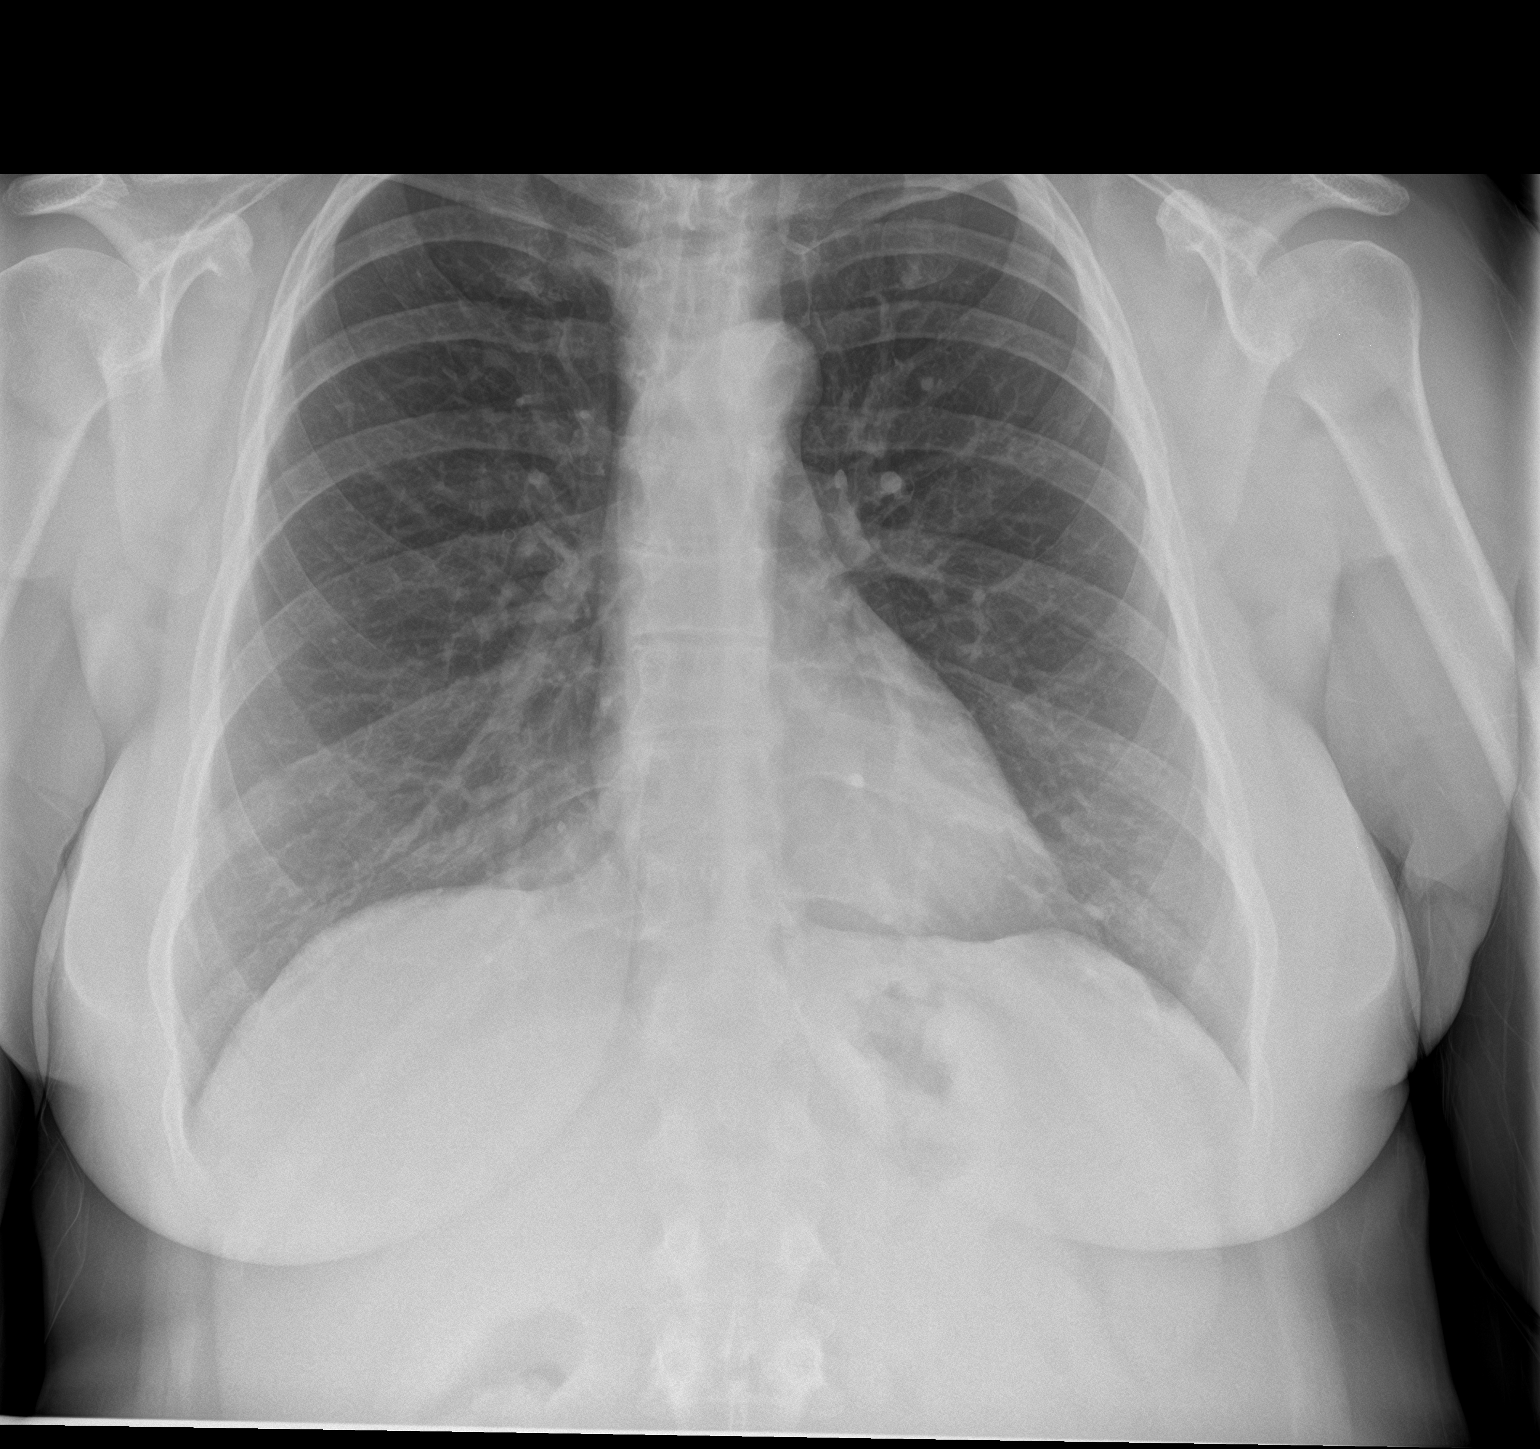

[chest lat]
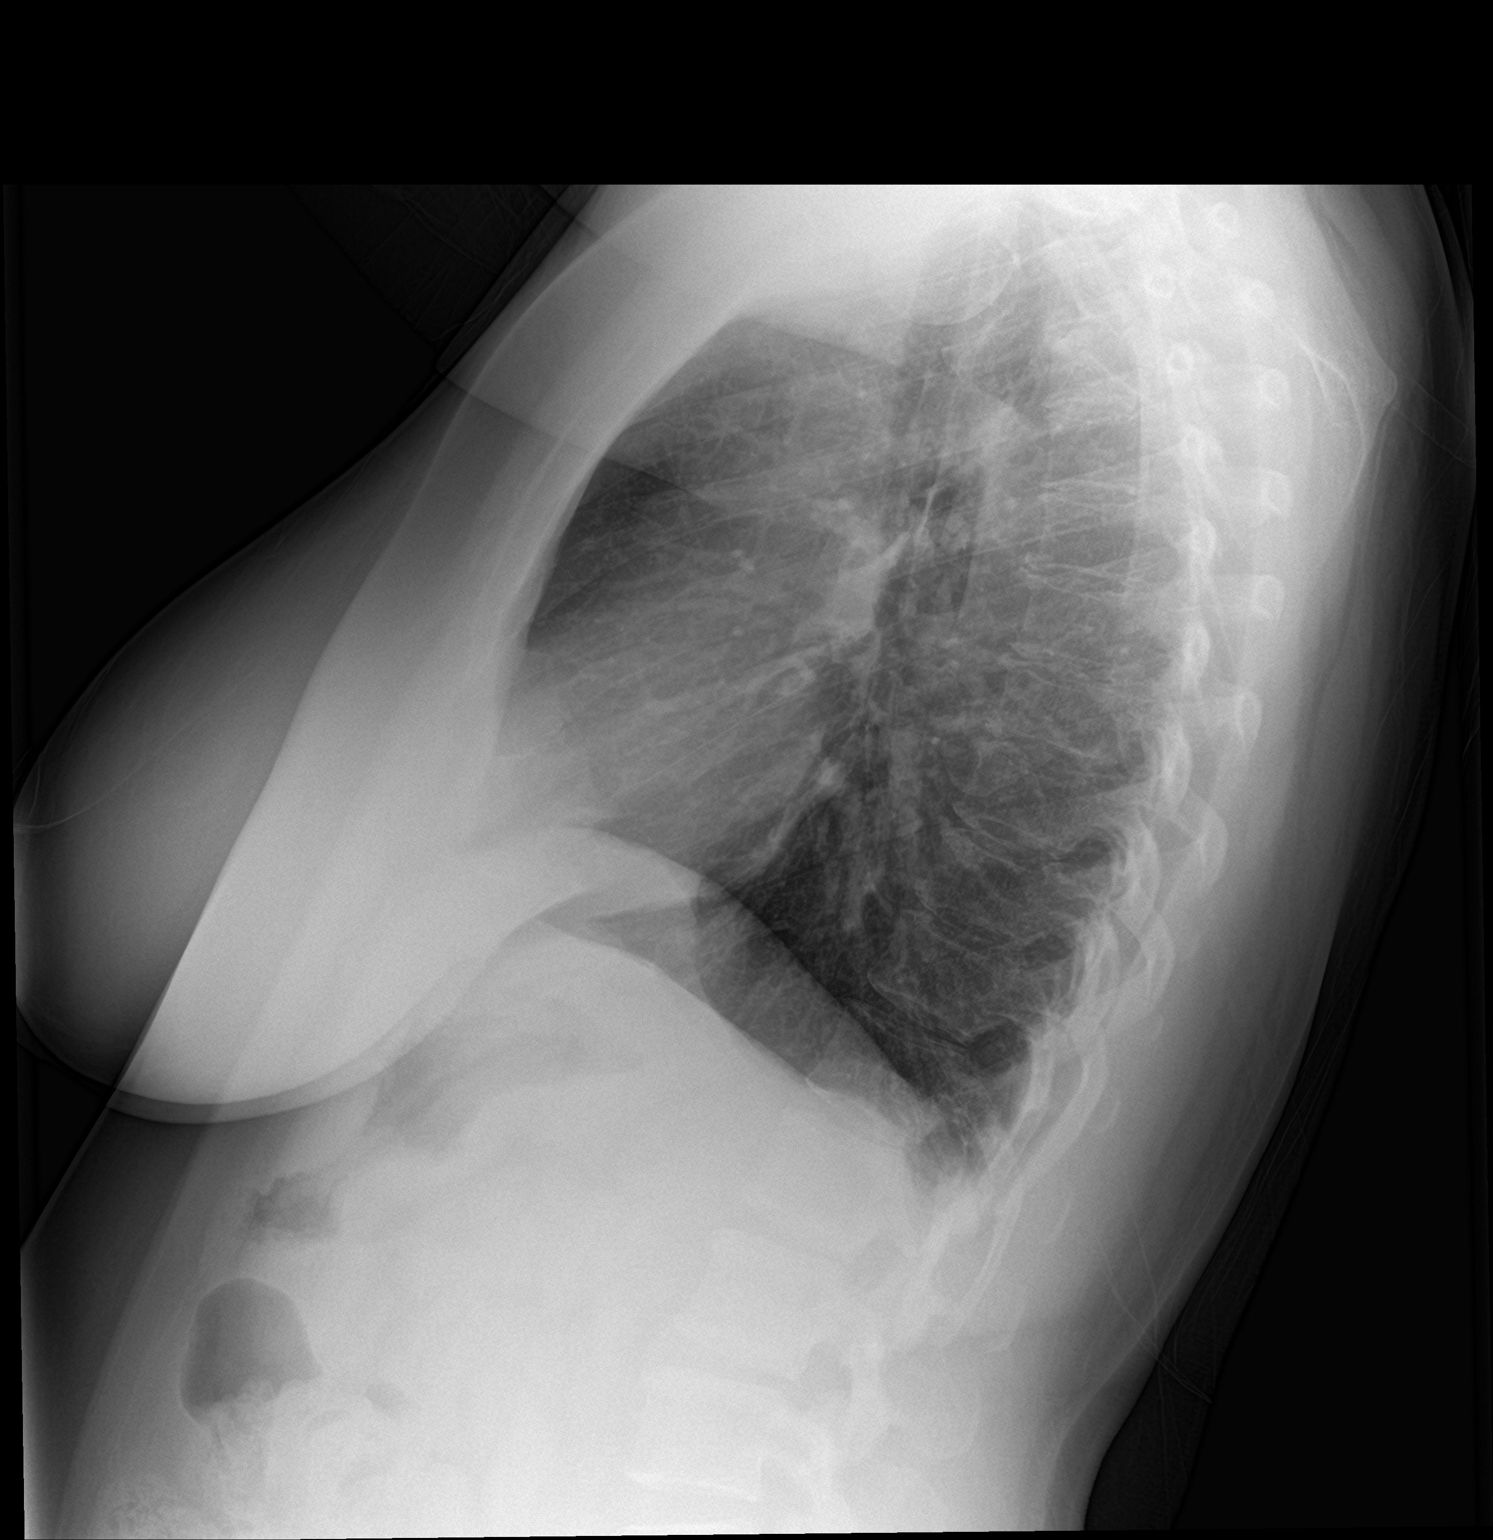

[2 of 2 positions shown; findings below may reference images not displayed]

FINDINGS: Lungs are clear. Heart size and pulmonary vascularity are normal. No
adenopathy. Trachea is midline. No bone lesions.
IMPRESSION: No abnormality noted.

## 2020-12-11 LAB — VITAMIN D 25 HYDROXY (VIT D DEFICIENCY, FRACTURES): Vit D, 25-Hydroxy: 36.3 ng/mL (ref 30.0–100.0)

## 2020-12-11 LAB — TSH: TSH: 1.35 u[IU]/mL (ref 0.450–4.500)

## 2020-12-11 LAB — T4, FREE: Free T4: 1.66 ng/dL (ref 0.82–1.77)

## 2020-12-12 ENCOUNTER — Encounter: Payer: Self-pay | Admitting: "Endocrinology

## 2020-12-12 ENCOUNTER — Other Ambulatory Visit: Payer: Self-pay

## 2020-12-12 ENCOUNTER — Ambulatory Visit (INDEPENDENT_AMBULATORY_CARE_PROVIDER_SITE_OTHER): Payer: Self-pay | Admitting: "Endocrinology

## 2020-12-12 VITALS — BP 156/98 | HR 68 | Ht 60.0 in | Wt 185.0 lb

## 2020-12-12 DIAGNOSIS — Z789 Other specified health status: Secondary | ICD-10-CM | POA: Insufficient documentation

## 2020-12-12 DIAGNOSIS — E890 Postprocedural hypothyroidism: Secondary | ICD-10-CM

## 2020-12-12 DIAGNOSIS — E782 Mixed hyperlipidemia: Secondary | ICD-10-CM

## 2020-12-12 MED ORDER — LEVOTHYROXINE SODIUM 112 MCG PO TABS
ORAL_TABLET | ORAL | 3 refills | Status: DC
Start: 2020-12-12 — End: 2022-02-24

## 2020-12-12 NOTE — Progress Notes (Signed)
12/12/2020, 9:32 AM      Endocrinology follow-up note   Subjective:    Patient ID: Cheryl Mckenzie, female    DOB: 02-05-1976, PCP Wallie Renshaw, FNP   Past Medical History:  Diagnosis Date  . Hypertension   . PONV (postoperative nausea and vomiting)   . 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 ABDOMINAL HYSTERECTOMY    . CESAREAN SECTION     x2  . THYROIDECTOMY N/A 09/22/2018   Procedure: TOTAL THYROIDECTOMY;  Surgeon: Franky Macho, MD;  Location: [facility];  Service: General;  Laterality: N/A;   Social History   Socioeconomic History  . Marital status: Divorced    Spouse name: Not on file  . Number of children: Not on file  . Years of education: Not on file  . Highest education level: Not on file  Occupational History  . Not on file  Tobacco Use  . Smoking status: Never Smoker  . Smokeless tobacco: Never Used  Vaping Use  . Vaping Use: Never used  Substance and Sexual Activity  . Alcohol use: Yes    Comment: occ  . Drug use: No  . Sexual activity: Yes    Birth control/protection: Surgical  Other Topics Concern  . Not on file  Social History Narrative  . Not on file   Social Determinants of Health   Financial Resource Strain: Not on file  Food Insecurity: Not on file  Transportation Needs: Not on file  Physical Activity: Not on file  Stress: Not on file  Social Connections: Not on file   Outpatient Encounter Medications as of 12/12/2020  Medication Sig  . calcium-vitamin D (OSCAL WITH D) 500-200 MG-UNIT tablet Take 1 tablet by mouth daily with breakfast.  . DEXILANT 60 MG capsule Take 1 capsule by mouth daily.  Marland Kitchen estradiol (CLIMARA) 0.06 MG/24HR Place 1 patch onto the skin every Monday.  . levothyroxine (UNITHROID) 112 MCG tablet TAKE ONE TABLET BY MOUTH EVERY DAY BEFORE breakfast  .  lisinopril-hydrochlorothiazide (ZESTORETIC) 20-25 MG tablet Take 1 tablet by mouth daily.  . simvastatin (ZOCOR) 20 MG tablet Take 1 tablet (20 mg total) by mouth at bedtime.  . [DISCONTINUED] UNITHROID 112 MCG tablet TAKE ONE TABLET BY MOUTH EVERY DAY BEFORE breakfast   No facility-administered encounter medications on file as of 12/12/2020.   ALLERGIES: Allergies  Allergen Reactions  . Latex Itching and Swelling    VACCINATION STATUS:  There is no immunization history on file for this patient.  HPI Cheryl Mckenzie is 45 y.o. female who is being seen in follow-up for postsurgical hypothyroidism.    She underwent thyroidectomy on September 22, 2018 for  compressive toxic multinodular goiter.  She is on replacement thyroid hormone currently 112 mcg p.o. daily before breakfast.  Her previsit thyroid function tests are consistent with appropriate replacement she has no new complaints .  She has regained the weight loss she achieved before.  She denies dysphagia, shortness of breath, nor voice change.  She remains on low-dose calcium supplement for hypocalcemia associated with her surgery.      -She reports improvement in her previous compressive  symptoms.  -She was found to have uncontrolled hypertension and hyperlipidemia.  She is hesitant to go on medications, taking her simvastatin once a week.  She has a plan to follow-up with her PCP to address blood pressure and repeat cholesterol check.  She underwent total hysterectomy in 2015 currently on estrogen supplement. -She denies any family history of thyroid malignancy.    Objective:    BP (!) 156/98   Pulse 68   Ht 5' (1.524 m)   Wt 185 lb (83.9 kg)   BMI 36.13 kg/m   Wt Readings from Last 3 Encounters:  12/12/20 185 lb (83.9 kg)  06/06/20 174 lb 6.4 oz (79.1 kg)  12/05/19 186 lb 3.2 oz (84.5 kg)    Recent Results (from the past 2160 hour(s))  TSH     Status: None   Collection Time: 12/10/20  3:51 PM  Result Value Ref Range    TSH 1.350 0.450 - 4.500 uIU/mL  T4, free     Status: None   Collection Time: 12/10/20  3:51 PM  Result Value Ref Range   Free T4 1.66 0.82 - 1.77 ng/dL  VITAMIN D 25 Hydroxy (Vit-D Deficiency, Fractures)     Status: None   Collection Time: 12/10/20  3:51 PM  Result Value Ref Range   Vit D, 25-Hydroxy 36.3 30.0 - 100.0 ng/mL    Comment: Vitamin D deficiency has been defined by the Institute of Medicine and an Endocrine Society practice guideline as a level of serum 25-OH vitamin D less than 20 ng/mL (1,2). The Endocrine Society went on to further define vitamin D insufficiency as a level between 21 and 29 ng/mL (2). 1. IOM (Institute of Medicine). 2010. Dietary reference    intakes for calcium and D. [place]: The    [employer]. 2. Holick MF, Binkley Long, Bischoff-Ferrari HA, et al.    Evaluation, treatment, and prevention of vitamin D    deficiency: an Endocrine Society clinical practice    guideline. JCEM. 2011 Jul; 96(7):1911-30.       Assessment & Plan:   1.  Postsurgical hypothyroidism:  -She underwent total thyroidectomy for toxic multinodular goiter on September 22, 2018.  Her thyroid function test previsit are consistent with appropriate replacement.  She is advised to continue levothyroxine 112 mcg p.o. daily before breakfast.     - We discussed about the correct intake of her thyroid hormone, on empty stomach at fasting, with water, separated by at least 30 minutes from breakfast and other medications,  and separated by more than 4 hours from calcium, iron, multivitamins, acid reflux medications (PPIs). -Patient is made aware of the fact that thyroid hormone replacement is needed for life, dose to be adjusted by periodic monitoring of thyroid function tests.   -She is advised to continue her current supply of Os-Cal once daily with breakfast until her next visit when she will have a CMP.  2.  Obesity/ weight management  -She has regained the weight she  lost prior to her last visit. - she acknowledges that there is a room for improvement in her food and drink choices. - Suggestion is made for her to avoid simple carbohydrates  from her diet including Cakes, Sweet Desserts, Ice Cream, Soda (diet and regular), Sweet Tea, Candies, Chips, Cookies, Store Bought Juices, Alcohol in Excess of  1-2 drinks a day, Artificial Sweeteners,  Coffee Creamer, and "Sugar-free" Products, Lemonade. This will help patient to have more stable blood glucose profile and potentially avoid  unintended weight gain.    3.  Hypertension -Blood pressure is uncontrolled.  She admits to inconsistent treatment with the prescription given by her PCP.  She is advised to continue her current treatment including lisinopril-hydrochlorothiazide 20-25 mg p.o. once a day.  She also has a appointment to address blood pressure and high cholesterol with her PCP.   She is hesitant, dislikes medications.  4.  Severe dyslipidemia - Evidently, she has responded to the low-dose simvastatin she was started during a prior visit.  She is currently taking a once a week.  She is advised to take it at least every other day until her next lab work .     - I advised her  to maintain close follow up with Wallie Renshaw, FNP for primary care needs.    I spent 30 minutes in the care of the patient today including review of labs from Thyroid Function, CMP, and other relevant labs ; imaging/biopsy records (current and previous including abstractions from other facilities); face-to-face time discussing  her lab results and symptoms, medications doses, her options of short and long term treatment based on the latest standards of care / guidelines;   and documenting the encounter.  Cheryl Mckenzie  participated in the discussions, expressed understanding, and voiced agreement with the above plans.  All questions were answered to her satisfaction. she is encouraged to contact clinic should she have any  questions or concerns prior to her return visit.   Follow up plan: Return in about 1 year (around 12/12/2021) for F/U with Pre-visit Labs.   Marquis Lunch, MD [facility] [address] [place], [place] [address] Phone: [phone]  Fax: [phone]     12/12/2020, 9:32 AM  This note was partially dictated with voice recognition software. Similar sounding words can be transcribed inadequately or may not  be corrected upon review.

## 2020-12-24 NOTE — Telephone Encounter (Signed)
error 

## 2021-03-11 ENCOUNTER — Other Ambulatory Visit: Payer: Self-pay | Admitting: "Endocrinology

## 2021-10-11 ENCOUNTER — Other Ambulatory Visit: Payer: Self-pay | Admitting: "Endocrinology

## 2021-11-27 ENCOUNTER — Other Ambulatory Visit: Payer: Self-pay

## 2021-11-27 DIAGNOSIS — E890 Postprocedural hypothyroidism: Secondary | ICD-10-CM

## 2021-12-18 ENCOUNTER — Ambulatory Visit: Payer: Self-pay | Admitting: "Endocrinology

## 2022-01-01 ENCOUNTER — Ambulatory Visit: Payer: Self-pay | Admitting: "Endocrinology

## 2022-01-22 ENCOUNTER — Ambulatory Visit: Payer: Self-pay | Admitting: "Endocrinology

## 2022-02-24 ENCOUNTER — Other Ambulatory Visit: Payer: Self-pay | Admitting: "Endocrinology

## 2022-05-06 ENCOUNTER — Other Ambulatory Visit: Payer: Self-pay | Admitting: "Endocrinology

## 2022-05-07 ENCOUNTER — Other Ambulatory Visit: Payer: Self-pay | Admitting: "Endocrinology

## 2022-05-22 LAB — TSH: TSH: 0.026 u[IU]/mL — ABNORMAL LOW (ref 0.450–4.500)

## 2022-05-22 LAB — T4, FREE: Free T4: 2.03 ng/dL — ABNORMAL HIGH (ref 0.82–1.77)

## 2022-05-25 ENCOUNTER — Ambulatory Visit (INDEPENDENT_AMBULATORY_CARE_PROVIDER_SITE_OTHER): Payer: Self-pay | Admitting: "Endocrinology

## 2022-05-25 ENCOUNTER — Encounter: Payer: Self-pay | Admitting: "Endocrinology

## 2022-05-25 VITALS — BP 110/82 | HR 84 | Ht 60.5 in | Wt 141.6 lb

## 2022-05-25 DIAGNOSIS — E890 Postprocedural hypothyroidism: Secondary | ICD-10-CM

## 2022-05-25 DIAGNOSIS — E782 Mixed hyperlipidemia: Secondary | ICD-10-CM

## 2022-05-25 MED ORDER — LEVOTHYROXINE SODIUM 88 MCG PO TABS
ORAL_TABLET | ORAL | 1 refills | Status: DC
Start: 2022-05-25 — End: 2022-11-15

## 2022-05-25 MED ORDER — LISINOPRIL-HYDROCHLOROTHIAZIDE 20-25 MG PO TABS: 1.0000 | ORAL_TABLET | Freq: Every day | ORAL | 1 refills | Status: DC

## 2022-05-25 NOTE — Progress Notes (Signed)
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    Graves disease   Past Surgical History:  Procedure Laterality Date   ABDOMINAL HYSTERECTOMY     CESAREAN SECTION     x2   THYROIDECTOMY N/A 09/22/2018   Procedure: TOTAL THYROIDECTOMY;  Surgeon: Franky Macho, MD;  Location: [facility];  Service: General;  Laterality: N/A;   Social History   Socioeconomic History   Marital status: Divorced    Spouse name: Not on file   Number of children: Not on file   Years of education: Not on file   Highest education level: Not on file  Occupational History   Not on file  Tobacco Use   Smoking status: Never   Smokeless tobacco: Never  Vaping Use   Vaping Use: Never used  Substance and Sexual Activity   Alcohol use: Yes    Comment: occ   Drug use: No   Sexual activity: Yes    Birth control/protection: Surgical  Other Topics Concern   Not on file  Social History Narrative   Not on file   Social Determinants of Health   Financial Resource Strain: Not on file  Food Insecurity: Not on file  Transportation Needs: Not on file  Physical Activity: Not on file  Stress: Not on file  Social Connections: Not on file   Outpatient Encounter Medications as of 05/25/2022  Medication Sig   Coenzyme Q10 (COQ10 PO) Take 1 tablet by mouth daily.   Lactobacillus (PROBIOTIC ACIDOPHILUS PO) Take 1 tablet by mouth daily.   calcium-vitamin D (OSCAL WITH D) 500-200 MG-UNIT tablet Take 1 tablet by mouth daily with breakfast.   DEXILANT 60 MG capsule Take 1 capsule by mouth daily.   estradiol (CLIMARA) 0.06 MG/24HR Place 1 patch onto the skin every Monday. (Patient not  taking: Reported on 05/25/2022)   levothyroxine (UNITHROID) 88 MCG tablet TAKE ONE TABLET BY MOUTH EVERY DAY BEFORE breakfast   lisinopril-hydrochlorothiazide (ZESTORETIC) 20-25 MG tablet Take 1 tablet by mouth daily.   simvastatin (ZOCOR) 20 MG tablet Take 1 tablet (20 mg total) by mouth at bedtime. (Patient taking differently: Take 40 mg by mouth at bedtime.)   [DISCONTINUED] levothyroxine (UNITHROID) 112 MCG tablet TAKE ONE TABLET BY MOUTH EVERY DAY BEFORE breakfast   [DISCONTINUED] lisinopril-hydrochlorothiazide (ZESTORETIC) 20-25 MG tablet TAKE ONE TABLET BY MOUTH EVERY DAY   No facility-administered encounter medications on file as of 05/25/2022.   ALLERGIES: Allergies  Allergen Reactions   Latex Itching and Swelling    VACCINATION STATUS:  There is no immunization history on file for this patient.  HPI Cheryl Mckenzie is 46 y.o. female who is being seen in follow-up for postsurgical hypothyroidism.    She underwent thyroidectomy on September 22, 2018 for  compressive toxic multinodular goiter.  She is on replacement thyroid hormone currently 112 mcg p.o. daily before breakfast.  Present with significant weight loss daily on  change of her lifestyle.    Her previsit thyroid function tests are consistent with slight over-replacement.    She denies dysphagia, shortness of breath, nor voice change.  She remains on low-dose calcium supplement for hypocalcemia associated with her surgery.      -She reports improvement in her previous compressive symptoms.  -She was found to have uncontrolled hypertension and hyperlipidemia.  She is hesitant to go on medications, taking her simvastatin once a week.  She has a plan to follow-up with her PCP to address blood pressure and repeat cholesterol check.  She underwent total hysterectomy in 2015 currently on estrogen supplement. -She denies any family history of thyroid malignancy.    Objective:    BP 110/82   Pulse 84   Ht 5' 0.5" (1.537 m)    Wt 141 lb 9.6 oz (64.2 kg)   BMI 27.20 kg/m   Wt Readings from Last 3 Encounters:  05/25/22 141 lb 9.6 oz (64.2 kg)  12/12/20 185 lb (83.9 kg)  06/06/20 174 lb 6.4 oz (79.1 kg)    Recent Results (from the past 2160 hour(s))  TSH     Status: Abnormal   Collection Time: 05/21/22 11:53 AM  Result Value Ref Range   TSH 0.026 (L) 0.450 - 4.500 uIU/mL  T4, Free     Status: Abnormal   Collection Time: 05/21/22 11:53 AM  Result Value Ref Range   Free T4 2.03 (H) 0.82 - 1.77 ng/dL      Assessment & Plan:   1.  Postsurgical hypothyroidism:  -She underwent total thyroidectomy for toxic multinodular goiter on September 22, 2018.  Her thyroid function test previsit are consistent with over replacement.  I discussed and lowered her levothyroxine to 88 mcg p.o. daily before breakfast.     - We discussed about the correct intake of her thyroid hormone, on empty stomach at fasting, with water, separated by at least 30 minutes from breakfast and other medications,  and separated by more than 4 hours from calcium, iron, multivitamins, acid reflux medications (PPIs). -Patient is made aware of the fact that thyroid hormone replacement is needed for life, dose to be adjusted by periodic monitoring of thyroid function tests.    -She is advised to continue her current supply of Os-Cal once daily with breakfast until her next visit when she will have a CMP.  2.  Obesity/ weight management  -She has regained the weight she lost prior to her last visit.  - she acknowledges that there is a room for improvement in her food and drink choices. - Suggestion is made for her to avoid simple carbohydrates  from her diet including Cakes, Sweet Desserts, Ice Cream, Soda (diet and regular), Sweet Tea, Candies, Chips, Cookies, Store Bought Juices, Alcohol in Excess of  1-2 drinks a day, Artificial Sweeteners,  Coffee Creamer, and "Sugar-free" Products, Lemonade. This will help patient to have more stable blood  glucose profile and potentially avoid unintended weight gain.  3.  Hypertension -Her blood pressure is controlled to target.    She is advised to continue her current treatment including lisinopril-hydrochlorothiazide 20-25 mg p.o. once a day.   4.  Severe dyslipidemia - She is responding to the statin with improvement in her LDL.  She is advised to take it simvastatin 40 mg p.o. daily at bedtime.    - I advised her  to maintain close follow up with Wallie Renshaw, FNP for primary care needs.   I spent 31 minutes in the  care of the patient today including review of labs from Thyroid Function, CMP, and other relevant labs ; imaging/biopsy records (current and previous including abstractions from other facilities); face-to-face time discussing  her lab results and symptoms, medications doses, her options of short and long term treatment based on the latest standards of care / guidelines;   and documenting the encounter.  Cheryl Mckenzie  participated in the discussions, expressed understanding, and voiced agreement with the above plans.  All questions were answered to her satisfaction. she is encouraged to contact clinic should she have any questions or concerns prior to her return visit.    Follow up plan: Return in about 6 months (around 11/24/2022) for F/U with Pre-visit Labs.   Glade Lloyd, MD [facility] [address] [place], [place] [address] Phone: [phone]  Fax: [phone]     05/25/2022, 7:14 PM  This note was partially dictated with voice recognition software. Similar sounding words can be transcribed inadequately or may not  be corrected upon review.

## 2022-05-25 NOTE — Patient Instructions (Signed)

## 2022-10-22 ENCOUNTER — Other Ambulatory Visit: Payer: Self-pay | Admitting: General Surgery

## 2022-11-12 ENCOUNTER — Other Ambulatory Visit: Payer: Self-pay | Admitting: "Endocrinology

## 2022-11-18 LAB — LIPID PANEL
Chol/HDL Ratio: 3.2 ratio (ref 0.0–4.4)
Cholesterol, Total: 207 mg/dL — ABNORMAL HIGH (ref 100–199)
HDL: 65 mg/dL (ref >39)
LDL Chol Calc (NIH): 98 mg/dL (ref 0–99)
Triglycerides: 262 mg/dL — ABNORMAL HIGH (ref 0–149)
VLDL Cholesterol Cal: 44 mg/dL — ABNORMAL HIGH (ref 5–40)

## 2022-11-18 LAB — TSH: TSH: 0.55 u[IU]/mL (ref 0.450–4.500)

## 2022-11-18 LAB — T4, FREE: Free T4: 1.78 ng/dL — ABNORMAL HIGH (ref 0.82–1.77)

## 2022-11-24 ENCOUNTER — Ambulatory Visit (INDEPENDENT_AMBULATORY_CARE_PROVIDER_SITE_OTHER): Payer: Self-pay | Admitting: "Endocrinology

## 2022-11-24 ENCOUNTER — Encounter: Payer: Self-pay | Admitting: "Endocrinology

## 2022-11-24 VITALS — BP 96/65 | HR 84 | Ht 60.5 in | Wt 147.2 lb

## 2022-11-24 DIAGNOSIS — E890 Postprocedural hypothyroidism: Secondary | ICD-10-CM

## 2022-11-24 DIAGNOSIS — E782 Mixed hyperlipidemia: Secondary | ICD-10-CM

## 2022-11-24 MED ORDER — LISINOPRIL-HYDROCHLOROTHIAZIDE 10-12.5 MG PO TABS: 1.0000 | ORAL_TABLET | Freq: Every day | ORAL | 1 refills | Status: DC

## 2022-11-24 MED ORDER — LEVOTHYROXINE SODIUM 88 MCG PO TABS: ORAL_TABLET | ORAL | 1 refills | Status: DC

## 2022-11-24 NOTE — Progress Notes (Signed)
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    Graves disease   Past Surgical History:  Procedure Laterality Date   ABDOMINAL HYSTERECTOMY     CESAREAN SECTION     x2   THYROIDECTOMY N/A 09/22/2018   Procedure: TOTAL THYROIDECTOMY;  Surgeon: Franky Macho, MD;  Location: [facility];  Service: General;  Laterality: N/A;   Social History   Socioeconomic History   Marital status: Divorced    Spouse name: Not on file   Number of children: Not on file   Years of education: Not on file   Highest education level: Not on file  Occupational History   Not on file  Tobacco Use   Smoking status: Never   Smokeless tobacco: Never  Vaping Use   Vaping Use: Never used  Substance and Sexual Activity   Alcohol use: Yes    Comment: occ   Drug use: No   Sexual activity: Yes    Birth control/protection: Surgical  Other Topics Concern   Not on file  Social History Narrative   Not on file   Social Determinants of Health   Financial Resource Strain: Not on file  Food Insecurity: Not on file  Transportation Needs: Not on file  Physical Activity: Not on file  Stress: Not on file  Social Connections: Not on file   Outpatient Encounter Medications as of 11/24/2022  Medication Sig   lisinopril-hydrochlorothiazide (ZESTORETIC) 10-12.5 MG tablet Take 1 tablet by mouth daily.   calcium-vitamin D (OSCAL WITH D) 500-200 MG-UNIT tablet Take 1 tablet by mouth daily with breakfast.   Coenzyme Q10 (COQ10 PO) Take 1 tablet by mouth daily.   DEXILANT 60 MG capsule Take 1 capsule by mouth daily.   Lactobacillus (PROBIOTIC ACIDOPHILUS PO) Take 1 tablet by mouth daily.    levothyroxine (UNITHROID) 88 MCG tablet TAKE ONE TABLET BY MOUTH EVERY DAY BEFORE breakfast   simvastatin (ZOCOR) 20 MG tablet Take 1 tablet (20 mg total) by mouth at bedtime. (Patient taking differently: Take 40 mg by mouth at bedtime.)   [DISCONTINUED] estradiol (CLIMARA) 0.06 MG/24HR Place 1 patch onto the skin every Monday. (Patient not taking: Reported on 05/25/2022)   [DISCONTINUED] levothyroxine (UNITHROID) 88 MCG tablet TAKE ONE TABLET BY MOUTH EVERY DAY BEFORE breakfast   [DISCONTINUED] lisinopril-hydrochlorothiazide (ZESTORETIC) 20-25 MG tablet TAKE ONE TABLET BY MOUTH DAILY   No facility-administered encounter medications on file as of 11/24/2022.   ALLERGIES: Allergies  Allergen Reactions   Latex Itching and Swelling    VACCINATION STATUS:  There is no immunization history on file for this patient.  HPI Cheryl Mckenzie is 47 y.o. female who is being seen in follow-up for postsurgical hypothyroidism.    She underwent thyroidectomy on September 22, 2018 for  compressive toxic multinodular goiter.  She is on replacement thyroid hormone currently 88 mcg p.o. daily before breakfast.  Due to iatrogenic thyrotoxicosis, her levothyroxine was  lowered from 112 mcg during the previous visit.  She presents with 6 pounds of weight gain, however generally feeling better.  Her previsit labs are consistent with appropriate replacement   She denies dysphagia, shortness of breath, nor voice change.  She remains on low-dose calcium supplement for hypocalcemia associated with her surgery.      -She reports improvement in her previous compressive symptoms.  -She was found to have uncontrolled hypertension and hyperlipidemia.  She is hesitant to go on medications, taking her simvastatin once a week.    She underwent total hysterectomy in 2015 currently on estrogen supplement. -She denies any family history of thyroid malignancy.    Objective:    BP 96/65 Comment: L arm with manuel cuff  Pulse  84   Ht 5' 0.5" (1.537 m)   Wt 147 lb 3.2 oz (66.8 kg)   BMI 28.27 kg/m   Wt Readings from Last 3 Encounters:  11/24/22 147 lb 3.2 oz (66.8 kg)  05/25/22 141 lb 9.6 oz (64.2 kg)  12/12/20 185 lb (83.9 kg)    Recent Results (from the past 2160 hour(s))  Lipid panel     Status: Abnormal   Collection Time: 11/17/22  4:02 PM  Result Value Ref Range   Cholesterol, Total 207 (H) 100 - 199 mg/dL   Triglycerides 811262 (H) 0 - 149 mg/dL   HDL 65 >91>39 mg/dL   VLDL Cholesterol Cal 44 (H) 5 - 40 mg/dL   LDL Chol Calc (NIH) 98 0 - 99 mg/dL   Chol/HDL Ratio 3.2 0.0 - 4.4 ratio    Comment:                                   T. Chol/HDL Ratio                                             Men  Women                               1/2 Avg.Risk  3.4    3.3                                   Avg.Risk  5.0    4.4                                2X Avg.Risk  9.6    7.1                                3X Avg.Risk 23.4   11.0   TSH     Status: None   Collection Time: 11/17/22  4:02 PM  Result Value Ref Range   TSH 0.550 0.450 - 4.500 uIU/mL  T4, free     Status: Abnormal   Collection Time: 11/17/22  4:02 PM  Result Value Ref Range   Free T4 1.78 (H) 0.82 - 1.77 ng/dL      Assessment & Plan:   1.  Postsurgical hypothyroidism:  -She underwent total thyroidectomy for toxic multinodular goiter on September 22, 2018.  Her thyroid function test previsit are consistent with appropriate replacement.  She is advised to continue levothyroxine 88 mcg p.o. daily before breakfast.     - We discussed about the correct intake of her thyroid hormone, on empty stomach at fasting, with water, separated by at least 30 minutes from breakfast and other medications,  and separated by more than 4 hours from calcium, iron, multivitamins, acid reflux medications (PPIs). -Patient is made aware of the fact that thyroid hormone replacement is needed for life, dose to be adjusted by periodic monitoring of thyroid function  tests.   -She is advised to continue her current supply of Os-Cal once daily with breakfast until her next visit when she will have a CMP.  2.  Obesity/ weight management  -She has regained the weight she lost prior to her last visit.  - she acknowledges that there is a room for improvement in her food and drink choices.  - Suggestion is made for her to avoid simple carbohydrates  from her diet including Cakes, Sweet Desserts, Ice Cream, Soda (diet and regular), Sweet Tea, Candies, Chips, Cookies, Store Bought Juices, Alcohol in Excess of  1-2 drinks a day, Artificial Sweeteners,  Coffee Creamer, and "Sugar-free" Products, Lemonade. This will help patient to have more stable blood glucose profile and potentially avoid unintended weight gain. 3.  Hypertension -Her blood pressure is controlled to target.    She is advised to continue her current treatment including lisinopril-hydrochlorothiazide 20-25 mg p.o. once a day.   4.  Severe dyslipidemia - She is responding to the statin with improvement in her LDL.  She is advised to take it simvastatin 40 mg p.o. daily at bedtime.    - I advised her  to maintain close follow up with Wallie Renshaw, FNP for primary care needs.   I spent  22  minutes in the care of the patient today including review of labs from Thyroid Function, CMP, and other relevant labs ; imaging/biopsy records (current and previous including abstractions from other facilities); face-to-face time discussing  her lab results and symptoms, medications doses, her options of short and long term treatment based on the latest standards of care / guidelines;   and documenting the encounter.  Cheryl Mckenzie  participated in the discussions, expressed understanding, and voiced agreement with the above plans.  All questions were answered to her satisfaction. she is encouraged to contact clinic should she have any questions or concerns prior to her return visit.    Follow up  plan: Return in about 6 months (around 05/26/2023) for Fasting Labs  in AM B4 8, A1c -[place].   Marquis Lunch, MD [facility] [address] [place], [place] [address] Phone: [phone]  Fax: [phone]     11/24/2022, 4:38 PM  This note was partially dictated with voice recognition software. Similar sounding words can be transcribed inadequately or may not  be corrected upon review.

## 2023-02-22 ENCOUNTER — Telehealth: Payer: Self-pay | Admitting: "Endocrinology

## 2023-02-22 MED ORDER — LEVOTHYROXINE SODIUM 88 MCG PO TABS: ORAL_TABLET | ORAL | 1 refills | Status: DC

## 2023-02-22 NOTE — Telephone Encounter (Signed)
Rx sent 

## 2023-02-22 NOTE — Telephone Encounter (Signed)
Pt is asking for a refill on her Unithroid to be sent to a diff pharmacy. [facility] in [place]. She said it is the only one

## 2023-02-25 ENCOUNTER — Telehealth: Payer: Self-pay

## 2023-02-25 ENCOUNTER — Other Ambulatory Visit: Payer: Self-pay

## 2023-02-25 DIAGNOSIS — E890 Postprocedural hypothyroidism: Secondary | ICD-10-CM

## 2023-02-25 MED ORDER — LEVOTHYROXINE SODIUM 88 MCG PO TABS
ORAL_TABLET | ORAL | 1 refills | Status: DC
Start: 2023-02-25 — End: 2023-05-26

## 2023-02-25 NOTE — Telephone Encounter (Signed)
Left a message requesting pt return call to the office. 

## 2023-03-29 ENCOUNTER — Other Ambulatory Visit: Payer: Self-pay

## 2023-03-29 MED ORDER — LISINOPRIL-HYDROCHLOROTHIAZIDE 10-12.5 MG PO TABS: 1.0000 | ORAL_TABLET | Freq: Every day | ORAL | 0 refills | Status: DC

## 2023-03-31 ENCOUNTER — Other Ambulatory Visit: Payer: Self-pay

## 2023-03-31 MED ORDER — LISINOPRIL-HYDROCHLOROTHIAZIDE 10-12.5 MG PO TABS: 1.0000 | ORAL_TABLET | Freq: Every day | ORAL | 0 refills | Status: DC

## 2023-05-20 LAB — LIPID PANEL
Chol/HDL Ratio: 5.2 {ratio} — ABNORMAL HIGH (ref 0.0–4.4)
Cholesterol, Total: 346 mg/dL — ABNORMAL HIGH (ref 100–199)
HDL: 66 mg/dL (ref >39)
LDL Chol Calc (NIH): 263 mg/dL — ABNORMAL HIGH (ref 0–99)
Triglycerides: 100 mg/dL (ref 0–149)
VLDL Cholesterol Cal: 17 mg/dL (ref 5–40)

## 2023-05-20 LAB — COMPREHENSIVE METABOLIC PANEL
ALT: 16 [IU]/L (ref 0–32)
AST: 20 [IU]/L (ref 0–40)
Albumin: 4.4 g/dL (ref 3.9–4.9)
Alkaline Phosphatase: 95 [IU]/L (ref 44–121)
BUN/Creatinine Ratio: 17 (ref 9–23)
BUN: 20 mg/dL (ref 6–24)
Bilirubin Total: <0.2 mg/dL (ref 0.0–1.2)
CO2: 23 mmol/L (ref 20–29)
Calcium: 9.7 mg/dL (ref 8.7–10.2)
Chloride: 104 mmol/L (ref 96–106)
Creatinine, Ser: 1.16 mg/dL — ABNORMAL HIGH (ref 0.57–1.00)
Globulin, Total: 2.8 g/dL (ref 1.5–4.5)
Glucose: 92 mg/dL (ref 70–99)
Potassium: 4.7 mmol/L (ref 3.5–5.2)
Sodium: 141 mmol/L (ref 134–144)
Total Protein: 7.2 g/dL (ref 6.0–8.5)
eGFR: 59 mL/min/{1.73_m2} — ABNORMAL LOW (ref >59)

## 2023-05-20 LAB — TSH: TSH: 12.1 u[IU]/mL — ABNORMAL HIGH (ref 0.450–4.500)

## 2023-05-20 LAB — T4, FREE: Free T4: 1.24 ng/dL (ref 0.82–1.77)

## 2023-05-24 ENCOUNTER — Other Ambulatory Visit: Payer: Self-pay | Admitting: "Endocrinology

## 2023-05-26 ENCOUNTER — Encounter: Payer: Self-pay | Admitting: "Endocrinology

## 2023-05-26 ENCOUNTER — Ambulatory Visit (INDEPENDENT_AMBULATORY_CARE_PROVIDER_SITE_OTHER): Payer: Self-pay | Admitting: "Endocrinology

## 2023-05-26 VITALS — BP 120/82 | HR 88 | Ht 60.5 in | Wt 158.8 lb

## 2023-05-26 DIAGNOSIS — E66811 Obesity, class 1: Secondary | ICD-10-CM

## 2023-05-26 DIAGNOSIS — E890 Postprocedural hypothyroidism: Secondary | ICD-10-CM

## 2023-05-26 DIAGNOSIS — E782 Mixed hyperlipidemia: Secondary | ICD-10-CM

## 2023-05-26 DIAGNOSIS — E6609 Other obesity due to excess calories: Secondary | ICD-10-CM

## 2023-05-26 DIAGNOSIS — Z6830 Body mass index (BMI) 30.0-30.9, adult: Secondary | ICD-10-CM

## 2023-05-26 LAB — POCT GLYCOSYLATED HEMOGLOBIN (HGB A1C): HbA1c, POC (controlled diabetic range): 5.6 % (ref 0.0–7.0)

## 2023-05-26 MED ORDER — LEVOTHYROXINE SODIUM 100 MCG PO TABS
ORAL_TABLET | ORAL | 1 refills | Status: AC
Start: 2023-05-26 — End: ?

## 2023-05-26 MED ORDER — SIMVASTATIN 20 MG PO TABS: 20.0000 mg | ORAL_TABLET | Freq: Every day | ORAL | 1 refills | Status: AC

## 2023-05-26 NOTE — Progress Notes (Signed)
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    Graves disease   Past Surgical History:  Procedure Laterality Date   ABDOMINAL HYSTERECTOMY     CESAREAN SECTION     x2   THYROIDECTOMY N/A 09/22/2018   Procedure: TOTAL THYROIDECTOMY;  Surgeon: Franky Macho, MD;  Location: [facility];  Service: General;  Laterality: N/A;   Social History   Socioeconomic History   Marital status: Divorced    Spouse name: Not on file   Number of children: Not on file   Years of education: Not on file   Highest education level: Not on file  Occupational History   Not on file  Tobacco Use   Smoking status: Never   Smokeless tobacco: Never  Vaping Use   Vaping status: Never Used  Substance and Sexual Activity   Alcohol use: Yes    Comment: occ   Drug use: No   Sexual activity: Yes    Birth control/protection: Surgical  Other Topics Concern   Not on file  Social History Narrative   Not on file   Social Determinants of Health   Financial Resource Strain: Not on file  Food Insecurity: Not on file  Transportation Needs: Not on file  Physical Activity: Not on file  Stress: Not on file  Social Connections: Not on file   Outpatient Encounter Medications as of 05/26/2023  Medication Sig   calcium-vitamin D (OSCAL WITH D) 500-200 MG-UNIT tablet Take 1 tablet by mouth daily with breakfast.   Coenzyme Q10 (COQ10 PO) Take 1 tablet by mouth daily.   DEXILANT 60 MG capsule Take 1 capsule by mouth daily.   Lactobacillus (PROBIOTIC ACIDOPHILUS PO) Take 1 tablet by mouth daily.   levothyroxine (UNITHROID) 100 MCG tablet TAKE ONE TABLET BY MOUTH EVERY DAY BEFORE  breakfast   lisinopril-hydrochlorothiazide (ZESTORETIC) 10-12.5 MG tablet TAKE ONE TABLET BY MOUTH DAILY   simvastatin (ZOCOR) 20 MG tablet Take 1 tablet (20 mg total) by mouth at bedtime.   [DISCONTINUED] levothyroxine (UNITHROID) 88 MCG tablet TAKE ONE TABLET BY MOUTH EVERY DAY BEFORE breakfast   [DISCONTINUED] simvastatin (ZOCOR) 20 MG tablet Take 1 tablet (20 mg total) by mouth at bedtime. (Patient taking differently: Take 40 mg by mouth at bedtime.)   No facility-administered encounter medications on file as of 05/26/2023.   ALLERGIES: Allergies  Allergen Reactions   Latex Itching and Swelling    VACCINATION STATUS:  There is no immunization history on file for this patient.  HPI Cheryl Mckenzie is 47 y.o. female who is being seen in follow-up for postsurgical hypothyroidism.    She underwent thyroidectomy on September 22, 2018 for  compressive toxic multinodular goiter.  She is on replacement thyroid hormone currently 88 mcg p.o. daily before breakfast.   Her previsit labs are consistent with slight under replacement.  Patient does have severe dyslipidemia for which she was supposed to be taking  Zocor 20 mg p.o. nightly.  After previously responding to this treatment, she has not taken it in the last several months which led to loss of control of her dyslipidemia including LDL at 263 worsening from 98. She presents with progressive weight gain.   She denies dysphagia, shortness of breath, nor voice change.  She remains on low-dose calcium supplement for hypocalcemia associated with her surgery.      -She reports improvement in her previous compressive symptoms.  -She was found to have uncontrolled hypertension and hyperlipidemia.   She underwent total hysterectomy in 2015 currently on estrogen supplement. -She denies any family history of thyroid malignancy.    Objective:    BP 120/82   Pulse 88   Ht 5' 0.5" (1.537 m)   Wt 158 lb 12.8 oz (72 kg)   BMI 30.50 kg/m   Wt  Readings from Last 3 Encounters:  05/26/23 158 lb 12.8 oz (72 kg)  11/24/22 147 lb 3.2 oz (66.8 kg)  05/25/22 141 lb 9.6 oz (64.2 kg)    Recent Results (from the past 2160 hour(s))  Comprehensive metabolic panel     Status: Abnormal   Collection Time: 05/19/23 10:43 AM  Result Value Ref Range   Glucose 92 70 - 99 mg/dL   BUN 20 6 - 24 mg/dL   Creatinine, Ser 1.61 (H) 0.57 - 1.00 mg/dL   eGFR 59 (L) >09 UE/AVW/0.98   BUN/Creatinine Ratio 17 9 - 23   Sodium 141 134 - 144 mmol/L   Potassium 4.7 3.5 - 5.2 mmol/L   Chloride 104 96 - 106 mmol/L   CO2 23 20 - 29 mmol/L   Calcium 9.7 8.7 - 10.2 mg/dL   Total Protein 7.2 6.0 - 8.5 g/dL   Albumin 4.4 3.9 - 4.9 g/dL   Globulin, Total 2.8 1.5 - 4.5 g/dL   Bilirubin Total <1.1 0.0 - 1.2 mg/dL   Alkaline Phosphatase 95 44 - 121 IU/L   AST 20 0 - 40 IU/L   ALT 16 0 - 32 IU/L  Lipid panel     Status: Abnormal   Collection Time: 05/19/23 10:43 AM  Result Value Ref Range   Cholesterol, Total 346 (H) 100 - 199 mg/dL   Triglycerides 914 0 - 149 mg/dL   HDL 66 >78 mg/dL   VLDL Cholesterol Cal 17 5 - 40 mg/dL   LDL Chol Calc (NIH) 295 (H) 0 - 99 mg/dL   LDL CALC COMMENT: Comment     Comment: Consider evaluating for Familial Hypercholesterolemia(FH), if clinically indicated.    Chol/HDL Ratio 5.2 (H) 0.0 - 4.4 ratio    Comment:                                   T. Chol/HDL Ratio                                             Men  Women                               1/2 Avg.Risk  3.4    3.3  Avg.Risk  5.0    4.4                                2X Avg.Risk  9.6    7.1                                3X Avg.Risk 23.4   11.0   TSH     Status: Abnormal   Collection Time: 05/19/23 10:43 AM  Result Value Ref Range   TSH 12.100 (H) 0.450 - 4.500 uIU/mL  T4, free     Status: None   Collection Time: 05/19/23 10:43 AM  Result Value Ref Range   Free T4 1.24 0.82 - 1.77 ng/dL  HgB U9W     Status: None   Collection Time:  05/26/23  4:07 PM  Result Value Ref Range   Hemoglobin A1C     HbA1c POC (<> result, manual entry)     HbA1c, POC (prediabetic range)     HbA1c, POC (controlled diabetic range) 5.6 0.0 - 7.0 %      Assessment & Plan:   1.  Postsurgical hypothyroidism:  -She underwent total thyroidectomy for toxic multinodular goiter on September 22, 2018.  Her thyroid function test previsit are consistent with under replacement.  I discussed and increase her levothyroxine to 100 mcg p.o. daily before breakfast.     - We discussed about the correct intake of her thyroid hormone, on empty stomach at fasting, with water, separated by at least 30 minutes from breakfast and other medications,  and separated by more than 4 hours from calcium, iron, multivitamins, acid reflux medications (PPIs). -Patient is made aware of the fact that thyroid hormone replacement is needed for life, dose to be adjusted by periodic monitoring of thyroid function tests.    -She is advised to continue her current supply of Os-Cal once daily with breakfast until her next visit when she will have a CMP.  2.  Obesity/ weight management  -She has regained the weight she lost prior to her last visit.  Her point-of-care A1c is 5.6% indicating absence of prediabetes/diabetes.  - she acknowledges that there is a room for improvement in her food and drink choices. - Suggestion is made for her to avoid simple carbohydrates  from her diet including Cakes, Sweet Desserts, Ice Cream, Soda (diet and regular), Sweet Tea, Candies, Chips, Cookies, Store Bought Juices, Alcohol in Excess of  1-2 drinks a day, Artificial Sweeteners,  Coffee Creamer, and "Sugar-free" Products, Lemonade. This will help patient to have more stable blood glucose profile and potentially avoid unintended weight gain.  3.  Hypertension -Her blood pressure is controlled to target.    She is advised to continue her current treatment including lisinopril-hydrochlorothiazide  20-25 mg p.o. once a day.   4.  Severe dyslipidemia - She is losing control with LDL at 263 due to the fact that she has not taken her statin.  She was given options including Repatha, however patient states that she will take simvastatin instead.  The last time she took statin consistently her LDL was 98.  I advised her to resume simvastatin 20 mg p.o. nightly.  Side effects and precautions discussed with her.     - I advised her  to maintain close follow up with Wallie Renshaw, FNP for primary care needs.   I spent  25  minutes in the care of the patient today including review of labs from Thyroid Function, CMP, and other relevant labs ; imaging/biopsy records (current and previous including abstractions from other facilities); face-to-face time discussing  her lab results and symptoms, medications doses, her options of short and long term treatment based on the latest standards of care / guidelines;   and documenting the encounter.  Kylin Genna  participated in the discussions, expressed understanding, and voiced agreement with the above plans.  All questions were answered to her satisfaction. she is encouraged to contact clinic should she have any questions or concerns prior to her return visit.    Follow up plan: Return in about 6 months (around 11/24/2023) for Fasting Labs  in AM B4 8.   Marquis Lunch, MD [facility] [address] [place], [place] [address] Phone: [phone]  Fax: [phone]     05/26/2023, 4:20 PM  This note was partially dictated with voice recognition software. Similar sounding words can be transcribed inadequately or may not  be corrected upon review.

## 2023-06-30 ENCOUNTER — Other Ambulatory Visit: Payer: Self-pay | Admitting: "Endocrinology

## 2023-11-24 ENCOUNTER — Ambulatory Visit: Payer: Self-pay | Admitting: "Endocrinology
